# Patient Record
Sex: Female | Born: 1955 | Race: White | Hispanic: No | Smoking: Never smoker
Health system: Southern US, Community
[De-identification: ages and names within clinical notes are randomized; demographics above are authoritative.]

## PROBLEM LIST (undated history)

## (undated) DIAGNOSIS — I2699 Other pulmonary embolism without acute cor pulmonale: Secondary | ICD-10-CM

## (undated) DIAGNOSIS — K859 Acute pancreatitis without necrosis or infection, unspecified: Secondary | ICD-10-CM

## (undated) DIAGNOSIS — D126 Benign neoplasm of colon, unspecified: Secondary | ICD-10-CM

## (undated) DIAGNOSIS — Z972 Presence of dental prosthetic device (complete) (partial): Secondary | ICD-10-CM

## (undated) DIAGNOSIS — T7840XA Allergy, unspecified, initial encounter: Secondary | ICD-10-CM

## (undated) DIAGNOSIS — M1990 Unspecified osteoarthritis, unspecified site: Secondary | ICD-10-CM

## (undated) DIAGNOSIS — Z973 Presence of spectacles and contact lenses: Secondary | ICD-10-CM

## (undated) DIAGNOSIS — D649 Anemia, unspecified: Secondary | ICD-10-CM

## (undated) DIAGNOSIS — Z9889 Other specified postprocedural states: Secondary | ICD-10-CM

## (undated) DIAGNOSIS — K621 Rectal polyp: Secondary | ICD-10-CM

## (undated) DIAGNOSIS — D1391 Familial adenomatous polyposis: Secondary | ICD-10-CM

## (undated) DIAGNOSIS — R7303 Prediabetes: Secondary | ICD-10-CM

## (undated) DIAGNOSIS — K219 Gastro-esophageal reflux disease without esophagitis: Secondary | ICD-10-CM

## (undated) DIAGNOSIS — D689 Coagulation defect, unspecified: Secondary | ICD-10-CM

## (undated) DIAGNOSIS — R112 Nausea with vomiting, unspecified: Secondary | ICD-10-CM

## (undated) DIAGNOSIS — D369 Benign neoplasm, unspecified site: Secondary | ICD-10-CM

## (undated) HISTORY — DX: Coagulation defect, unspecified: D68.9

## (undated) HISTORY — PX: MULTIPLE TOOTH EXTRACTIONS: SHX2053

## (undated) HISTORY — DX: Unspecified osteoarthritis, unspecified site: M19.90

## (undated) HISTORY — DX: Familial adenomatous polyposis: D13.91

## (undated) HISTORY — PX: ESOPHAGOGASTRODUODENOSCOPY: SHX1529

## (undated) HISTORY — PX: TUBAL LIGATION: SHX77

## (undated) HISTORY — DX: Other pulmonary embolism without acute cor pulmonale: I26.99

## (undated) HISTORY — DX: Benign neoplasm, unspecified site: D36.9

## (undated) HISTORY — DX: Allergy, unspecified, initial encounter: T78.40XA

## (undated) HISTORY — PX: FLEXIBLE SIGMOIDOSCOPY: SHX1649

## (undated) HISTORY — DX: Gastro-esophageal reflux disease without esophagitis: K21.9

## (undated) HISTORY — PX: HERNIA REPAIR: SHX51

## (undated) HISTORY — PX: ELBOW SURGERY: SHX618

## (undated) HISTORY — DX: Rectal polyp: K62.1

## (undated) HISTORY — DX: Benign neoplasm of colon, unspecified: D12.6

## (undated) HISTORY — PX: CARPAL TUNNEL RELEASE: SHX101

## (undated) HISTORY — PX: APPENDECTOMY: SHX54

## (undated) HISTORY — DX: Acute pancreatitis without necrosis or infection, unspecified: K85.90

## (undated) HISTORY — PX: LUMBAR SPINE SURGERY: SHX701

## (undated) SURGERY — EGD (ESOPHAGOGASTRODUODENOSCOPY)
Anesthesia: Monitor Anesthesia Care

---

## 1974-10-17 HISTORY — PX: TOTAL COLECTOMY: SHX852

## 1988-10-17 HISTORY — PX: ROTATOR CUFF REPAIR: SHX139

## 1998-06-30 ENCOUNTER — Ambulatory Visit (HOSPITAL_COMMUNITY): Admission: RE | Admit: 1998-06-30 | Discharge: 1998-06-30 | Payer: Self-pay | Admitting: Family Medicine

## 1998-10-27 ENCOUNTER — Other Ambulatory Visit: Admission: RE | Admit: 1998-10-27 | Discharge: 1998-10-27 | Payer: Self-pay | Admitting: Gastroenterology

## 1998-12-31 ENCOUNTER — Encounter: Payer: Self-pay | Admitting: Gastroenterology

## 1998-12-31 ENCOUNTER — Ambulatory Visit (HOSPITAL_COMMUNITY): Admission: RE | Admit: 1998-12-31 | Discharge: 1998-12-31 | Payer: Self-pay | Admitting: Gastroenterology

## 1999-11-11 ENCOUNTER — Other Ambulatory Visit: Admission: RE | Admit: 1999-11-11 | Discharge: 1999-11-11 | Payer: Self-pay | Admitting: Gastroenterology

## 1999-11-11 ENCOUNTER — Encounter (INDEPENDENT_AMBULATORY_CARE_PROVIDER_SITE_OTHER): Payer: Self-pay | Admitting: Specialist

## 2000-10-27 ENCOUNTER — Other Ambulatory Visit: Admission: RE | Admit: 2000-10-27 | Discharge: 2000-10-27 | Payer: Self-pay | Admitting: Gastroenterology

## 2000-10-27 ENCOUNTER — Encounter (INDEPENDENT_AMBULATORY_CARE_PROVIDER_SITE_OTHER): Payer: Self-pay | Admitting: Specialist

## 2001-05-08 ENCOUNTER — Ambulatory Visit (HOSPITAL_BASED_OUTPATIENT_CLINIC_OR_DEPARTMENT_OTHER): Admission: RE | Admit: 2001-05-08 | Discharge: 2001-05-08 | Payer: Self-pay | Admitting: Orthopedic Surgery

## 2002-01-28 ENCOUNTER — Other Ambulatory Visit: Admission: RE | Admit: 2002-01-28 | Discharge: 2002-01-28 | Payer: Self-pay | Admitting: Obstetrics and Gynecology

## 2002-11-07 ENCOUNTER — Encounter: Payer: Self-pay | Admitting: Gastroenterology

## 2002-11-07 ENCOUNTER — Ambulatory Visit (HOSPITAL_COMMUNITY): Admission: RE | Admit: 2002-11-07 | Discharge: 2002-11-07 | Payer: Self-pay | Admitting: Gastroenterology

## 2003-02-04 ENCOUNTER — Other Ambulatory Visit: Admission: RE | Admit: 2003-02-04 | Discharge: 2003-02-04 | Payer: Self-pay | Admitting: Obstetrics and Gynecology

## 2004-02-09 ENCOUNTER — Other Ambulatory Visit: Admission: RE | Admit: 2004-02-09 | Discharge: 2004-02-09 | Payer: Self-pay | Admitting: Obstetrics and Gynecology

## 2004-02-16 ENCOUNTER — Ambulatory Visit (HOSPITAL_COMMUNITY): Admission: RE | Admit: 2004-02-16 | Discharge: 2004-02-16 | Payer: Self-pay | Admitting: Orthopedic Surgery

## 2004-04-08 ENCOUNTER — Ambulatory Visit (HOSPITAL_BASED_OUTPATIENT_CLINIC_OR_DEPARTMENT_OTHER): Admission: RE | Admit: 2004-04-08 | Discharge: 2004-04-08 | Payer: Self-pay | Admitting: Orthopedic Surgery

## 2004-12-27 ENCOUNTER — Ambulatory Visit: Payer: Self-pay | Admitting: Gastroenterology

## 2005-01-11 ENCOUNTER — Ambulatory Visit: Payer: Self-pay | Admitting: Gastroenterology

## 2005-02-22 ENCOUNTER — Other Ambulatory Visit: Admission: RE | Admit: 2005-02-22 | Discharge: 2005-02-22 | Payer: Self-pay | Admitting: Obstetrics and Gynecology

## 2006-11-17 ENCOUNTER — Ambulatory Visit: Payer: Self-pay | Admitting: Gastroenterology

## 2006-11-30 ENCOUNTER — Ambulatory Visit: Payer: Self-pay | Admitting: Internal Medicine

## 2006-11-30 ENCOUNTER — Encounter (INDEPENDENT_AMBULATORY_CARE_PROVIDER_SITE_OTHER): Payer: Self-pay | Admitting: *Deleted

## 2007-01-26 ENCOUNTER — Ambulatory Visit: Payer: Self-pay | Admitting: Internal Medicine

## 2007-02-02 ENCOUNTER — Encounter (INDEPENDENT_AMBULATORY_CARE_PROVIDER_SITE_OTHER): Payer: Self-pay | Admitting: Specialist

## 2007-02-02 ENCOUNTER — Ambulatory Visit (HOSPITAL_COMMUNITY): Admission: RE | Admit: 2007-02-02 | Discharge: 2007-02-02 | Payer: Self-pay | Admitting: Internal Medicine

## 2007-07-20 ENCOUNTER — Encounter: Payer: Self-pay | Admitting: Internal Medicine

## 2008-01-01 ENCOUNTER — Ambulatory Visit: Payer: Self-pay | Admitting: Internal Medicine

## 2008-01-04 ENCOUNTER — Ambulatory Visit: Payer: Self-pay | Admitting: Internal Medicine

## 2008-01-04 ENCOUNTER — Encounter: Payer: Self-pay | Admitting: Internal Medicine

## 2008-06-20 ENCOUNTER — Encounter: Admission: RE | Admit: 2008-06-20 | Discharge: 2008-06-20 | Payer: Self-pay | Admitting: Obstetrics and Gynecology

## 2008-12-19 ENCOUNTER — Encounter: Admission: RE | Admit: 2008-12-19 | Discharge: 2008-12-19 | Payer: Self-pay | Admitting: Obstetrics and Gynecology

## 2009-02-09 ENCOUNTER — Encounter: Payer: Self-pay | Admitting: Internal Medicine

## 2009-02-10 ENCOUNTER — Encounter: Payer: Self-pay | Admitting: Internal Medicine

## 2009-02-11 ENCOUNTER — Telehealth: Payer: Self-pay | Admitting: Internal Medicine

## 2009-02-12 ENCOUNTER — Encounter: Payer: Self-pay | Admitting: Internal Medicine

## 2009-02-12 ENCOUNTER — Ambulatory Visit: Payer: Self-pay | Admitting: Internal Medicine

## 2009-03-10 ENCOUNTER — Telehealth: Payer: Self-pay | Admitting: Internal Medicine

## 2009-07-08 ENCOUNTER — Encounter: Admission: RE | Admit: 2009-07-08 | Discharge: 2009-07-08 | Payer: Self-pay | Admitting: Obstetrics and Gynecology

## 2009-12-01 ENCOUNTER — Telehealth: Payer: Self-pay | Admitting: Internal Medicine

## 2010-03-23 ENCOUNTER — Encounter (INDEPENDENT_AMBULATORY_CARE_PROVIDER_SITE_OTHER): Payer: Self-pay | Admitting: *Deleted

## 2010-03-26 ENCOUNTER — Ambulatory Visit: Payer: Self-pay | Admitting: Internal Medicine

## 2010-03-26 ENCOUNTER — Encounter (INDEPENDENT_AMBULATORY_CARE_PROVIDER_SITE_OTHER): Payer: Self-pay | Admitting: *Deleted

## 2010-04-01 ENCOUNTER — Ambulatory Visit: Payer: Self-pay | Admitting: Internal Medicine

## 2010-04-04 ENCOUNTER — Encounter: Payer: Self-pay | Admitting: Internal Medicine

## 2010-05-27 ENCOUNTER — Telehealth: Payer: Self-pay | Admitting: Internal Medicine

## 2010-06-18 ENCOUNTER — Encounter: Payer: Self-pay | Admitting: Internal Medicine

## 2010-09-20 ENCOUNTER — Encounter: Payer: Self-pay | Admitting: Internal Medicine

## 2010-09-21 ENCOUNTER — Encounter: Payer: Self-pay | Admitting: Internal Medicine

## 2010-09-21 ENCOUNTER — Telehealth: Payer: Self-pay | Admitting: Internal Medicine

## 2010-09-23 ENCOUNTER — Encounter: Payer: Self-pay | Admitting: Internal Medicine

## 2010-09-24 ENCOUNTER — Encounter: Payer: Self-pay | Admitting: Internal Medicine

## 2010-09-24 DIAGNOSIS — K838 Other specified diseases of biliary tract: Secondary | ICD-10-CM | POA: Insufficient documentation

## 2010-09-30 ENCOUNTER — Ambulatory Visit: Payer: Self-pay | Admitting: Internal Medicine

## 2010-10-17 DIAGNOSIS — R112 Nausea with vomiting, unspecified: Secondary | ICD-10-CM

## 2010-10-17 DIAGNOSIS — K859 Acute pancreatitis without necrosis or infection, unspecified: Secondary | ICD-10-CM

## 2010-10-17 DIAGNOSIS — D689 Coagulation defect, unspecified: Secondary | ICD-10-CM

## 2010-10-17 DIAGNOSIS — Z9889 Other specified postprocedural states: Secondary | ICD-10-CM

## 2010-10-17 HISTORY — DX: Other specified postprocedural states: R11.2

## 2010-10-17 HISTORY — DX: Other specified postprocedural states: Z98.890

## 2010-10-17 HISTORY — DX: Coagulation defect, unspecified: D68.9

## 2010-10-17 HISTORY — DX: Acute pancreatitis without necrosis or infection, unspecified: K85.90

## 2010-10-27 ENCOUNTER — Encounter: Payer: Self-pay | Admitting: Internal Medicine

## 2010-11-02 ENCOUNTER — Encounter: Payer: Self-pay | Admitting: Internal Medicine

## 2010-11-04 ENCOUNTER — Encounter: Payer: Self-pay | Admitting: Internal Medicine

## 2010-11-16 NOTE — Progress Notes (Signed)
Summary: Triage   Phone Note Call from Patient Call back at Home Phone [phone]   Caller: Patient Call For: Dr. Leone Payor Reason for Call: Talk to Nurse Summary of Call: Pt wants to know if we can work her in for a egd before friday when she goes to [facility] to see the surgeon Initial call taken by: [place] Johnson,  September 21, 2010 3:19 PM  Follow-up for Phone Call        Patient states she was contacted by Dr Recardo Evangelist office about needing a colon before her office visit on Friday with Dr Abigail Miyamoto.  Reviewed with Dr Leone Payor . He has asked that we contact Dr Recardo Evangelist office and advise them of the flex she had in June of  this year.  I have left a voicemail with her assistant Bridgette and asked her to call me back.  I have faxed the procedures from [phone] along with their paths to her attention.  Patient  is advised that we will tentatively plan for flex on Thursday am if Dr Abigail Miyamoto would like the flex repeated. Follow-up by: Darcey Nora RN, CGRN,  September 21, 2010 4:02 PM     Appended Document: Triage I spoke with Bridgette and they have the records I sent last night and they say that this should work for the appointment on Friday.  I have asked them to call us back if we can be of any further assistance.     Appended Document: Xray Patient scheduled for KUB to Verify Stent Location 09/30/10 at [facility] for 4pm. 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

## 2010-11-16 NOTE — Progress Notes (Signed)
Summary: Samples of Medication   Phone Note Call from Patient Call back at [phone]   Caller: Patient Call For: Dr. Leone Payor Reason for Call: Refill Medication, Talk to Nurse Summary of Call: Pt. wants to know if we have any samples of Celebrex. Her doctor at [facility] does not get samples and she is waiting on Ins. to approve the medication Initial call taken by: Karna Christmas,  December 01, 2009 9:05 AM  Follow-up for Phone Call        Advised pt we do not get Celebrex samples.  Pt agreeable. Follow-up by: Francee Piccolo CMA Duncan Dull),  December 02, 2009 11:40 AM

## 2010-11-16 NOTE — Miscellaneous (Signed)
Summary: [facility] Previsit/prep  Clinical Lists Changes  Allergies: Added new allergy or adverse reaction of SULFA Observations: Added new observation of NKA: F (03/26/2010 15:51)

## 2010-11-16 NOTE — Procedures (Signed)
Summary: [facility]  [facility]   Imported By: Lester [place] 07/02/2010 08:51:00  _____________________________________________________________________  External Attachment:    Type:   Image     Comment:   External Document

## 2010-11-16 NOTE — Letter (Signed)
Summary[facility]  [facility]   Imported By: Lester [place] 07/02/2010 08:55:47  _____________________________________________________________________  External Attachment:    Type:   Image     Comment:   External Document

## 2010-11-16 NOTE — Letter (Signed)
Summary: [facility] Instructions  [facility]  [address] [place], [place] [address]   Phone: [phone]  Fax: [phone]       Emma Lutz    1956-05-09    MRN: [id number]        Procedure Day Dorna Bloom:  Lenor Coffin  04/01/10     Arrival Time:  7:30AM     Procedure Time:  8:30AM     Location of Procedure:                    _X _  [facility] (4th Floor)  PREPARATION FOR COLONOSCOPY WITH MOVIPREP   Starting 5 days prior to your procedure 03/27/10 do not eat nuts, seeds, popcorn, corn, beans, peas,  salads, or any raw vegetables.  Do not take any fiber supplements (e.g. Metamucil, Citrucel, and Benefiber).  THE DAY BEFORE YOUR PROCEDURE         DATE: 03/31/10  DAY: WEDNESDAY  1.  Drink clear liquids the entire day-NO SOLID FOOD  2.  Do not drink anything colored red or purple.  Avoid juices with pulp.  No orange juice.  3.  Drink at least 64 oz. (8 glasses) of fluid/clear liquids during the day to prevent dehydration and help the prep work efficiently.  CLEAR LIQUIDS INCLUDE: Water Jello Ice Popsicles Tea (sugar ok, no milk/cream) Powdered fruit flavored drinks Coffee (sugar ok, no milk/cream) Gatorade Juice: apple, white grape, white cranberry  Lemonade Clear bullion, consomm, broth Carbonated beverages (any kind) Strained chicken noodle soup Hard Candy                             4.  In the morning, mix first dose of MoviPrep solution:    Empty 1 Pouch A and 1 Pouch B into the disposable container    Add lukewarm drinking water to the top line of the container. Mix to dissolve    Refrigerate (mixed solution should be used within 24 hrs)  5.  Begin drinking the prep at 5:00 p.m. The MoviPrep container is divided by 4 marks.   Every 15 minutes drink the solution down to the next mark (approximately 8 oz) until the full liter is complete.   6.  Follow completed prep with 16 oz of clear liquid of your choice (Nothing red or purple).   Continue to drink clear liquids until bedtime.  7.  Before going to bed, mix second dose of MoviPrep solution:    Empty 1 Pouch A and 1 Pouch B into the disposable container    Add lukewarm drinking water to the top line of the container. Mix to dissolve    Refrigerate  THE DAY OF YOUR PROCEDURE      DATE: 04/01/10  DAY: THURSDAY  Beginning at 3:30AM (5 hours before procedure):         1. Every 15 minutes, drink the solution down to the next mark (approx 8 oz) until the full liter is complete.  2. Follow completed prep with 16 oz. of clear liquid of your choice.    3. You may drink clear liquids until 6:30AM (2 HOURS BEFORE PROCEDURE).   MEDICATION INSTRUCTIONS  Unless otherwise instructed, you should take regular prescription medications with a small sip of water   as early as possible the morning of your procedure.          OTHER INSTRUCTIONS  You will need a responsible adult at  least 56 years of age to accompany you and drive you home.   This person must remain in the waiting room during your procedure.  Wear loose fitting clothing that is easily removed.  Leave jewelry and other valuables at home.  However, you may wish to bring a book to read or  an iPod/MP3 player to listen to music as you wait for your procedure to start.  Remove all body piercing jewelry and leave at home.  Total time from sign-in until discharge is approximately 2-3 hours.  You should go home directly after your procedure and rest.  You can resume normal activities the  day after your procedure.  The day of your procedure you should not:   Drive   Make legal decisions   Operate machinery   Drink alcohol   Return to work  You will receive specific instructions about eating, activities and medications before you leave.    The above instructions have been reviewed and explained to me by   Wyona Almas RN  March 26, 2010 4:44 PM _    I fully understand and can verbalize these  instructions _____________________________ Date _________

## 2010-11-16 NOTE — Letter (Signed)
Summary: Patient Notice- Polyp Results  [facility]  [address] [place], [place] [address]   Phone: [phone]  Fax: [phone]        April 04, 2010 MRN: [id number]    [facility] [address] [place], [place]  [address]    Dear Ms. Poteete,  I am pleased to inform you that the rectal and colon polyps removed during your recent sigmoidoscopy were found to be benign (no cancer detected) upon pathologic examination.  I recommend you have a repeat sigmoidoscopy examination in 1 year to monitor the polyposis and look for cancer and other problems you are at risk for. Continue Celebrex and follow-up with Dr. Wyline Mood also.  Should you develop new or worsening symptoms of abdominal pain, bowel habit changes or bleeding from the rectum or bowels, please schedule an evaluation with either your primary care physician or with me.  Please call us if you are having persistent problems or have questions about your condition that have not been fully answered at this time.  Sincerely,  Iva Boop MD, [facility]  This letter has been electronically signed by your physician.  Appended Document: Patient Notice- Polyp Results letter mailed.

## 2010-11-16 NOTE — Letter (Signed)
Summary: [facility]  [address] [place], [place] [address]   Phone: [phone]  Fax: [phone]       KAMYIAH COLANTONIO    Aug 05, 1956    MRN: [id number]        Procedure Day Dorna Bloom:  Lenor Coffin  04/01/10     Arrival Time:  7:30am     Procedure Time:  8:30am     Location of Procedure:                    Juliann Pares  [facility] (4th Floor)    PREPARATION FOR FLEXIBLE SIGMOIDOSCOPY WITH MAGNESIUM CITRATE  Prior to the day before your procedure, purchase one 8 oz. bottle of Magnesium Citrate and one Fleet Enema from the laxative section of your drugstore.  _________________________________________________________________________________________________  THE DAY BEFORE YOUR PROCEDURE             DATE: 03/31/10   DAY:  WEDNESDAY  1.   Have a clear liquid dinner the night before your procedure.  2.   Do not drink anything colored red or purple.  Avoid juices with pulp.  No orange juice.              CLEAR LIQUIDS INCLUDE: Water Jello Ice Popsicles Tea (sugar ok, no milk/cream) Powdered fruit flavored drinks Coffee (sugar ok, no milk/cream) Gatorade Juice: apple, white grape, white cranberry  Lemonade Clear bullion, consomm, broth Carbonated beverages (any kind) Strained chicken noodle soup Hard Candy   3.   At 7:00 pm the night before your procedure, drink one bottle of Magnesium Citrate over ice.  4.   Drink at least 3 more glasses of clear liquids before bedtime (preferably juices).  5.   Results are expected usually within 1 to 6 hours after taking the Magnesium Citrate.  ___________________________________________________________________________________________________  THE DAY OF YOUR PROCEDURE            DATE: 04/01/10 DAY:  THURSDAY  1.   Use Fleet Enema one hour prior to coming for procedure.  2.   You may drink clear liquids until  6:30am (2 hours before exam)       MEDICATION INSTRUCTIONS  Unless  otherwise instructed, you should take regular prescription medications with a small sip of water as early as possible the morning of your procedure.        OTHER INSTRUCTIONS  You will need a responsible adult at least 56 years of age to accompany you and drive you home.   This person must remain in the waiting room during your procedure.  Wear loose fitting clothing that is easily removed.  Leave jewelry and other valuables at home.  However, you may wish to bring a book to read or an iPod/MP3 player to listen to music as you wait for your procedure to start.  Remove all body piercing jewelry and leave at home.  Total time from sign-in until discharge is approximately 2-3 hours.  You should go home directly after your procedure and rest.  You can resume normal activities the day after your procedure.  The day of your procedure you should not:   Drive   Make legal decisions   Operate machinery   Drink alcohol   Return to work  You will receive specific instructions about eating, activities and medications before you leave.   The above instructions have been reviewed and explained to me by   Wyona Almas RN  March 26, 2010 4:33  PM     I fully understand and can verbalize these instructions _____________________________ Date _________   Appended Document: Flexsig Instructons Changed pt. to [facility] prep.  She states she's hard to clean out for her flex sigs and has taken the Miralax prep in the past.

## 2010-11-16 NOTE — Progress Notes (Signed)
Summary: Triage   Phone Note Call from Patient Call back at Home Phone [phone]   Caller: Patient Call For: Dr. Leone Payor Reason for Call: Talk to Nurse Summary of Call: Pt. needs help getting appt. with Dr. Wyline Mood @ [facility] to sch'd an ENDO...she has tried to sch's appt. and has not received a call back. Initial call taken by: Karna Christmas,  May 27, 2010 11:39 AM  Follow-up for Phone Call        Pt. needs to schedule an Endoscopy with Dr.Branch, was told by Dr.Branch she needs to have it done in 01/2010. Pt. is unable to get any response from Dr.Branche's office.  Pt. wants to know if Dr.Gessner may know anything?   I will attempt to contact Dr.Branch's nurse. [phone], [phone])   Follow-up by: Laureen Ochs LPN,  May 27, 2010 11:47 AM  Additional Follow-up for Phone Call Additional follow up Details #1::        I left a message for [facility], in scheduling,  to call me.  I have also spoke with Dr.Branch's office,Tabitha, she sees a reminder in the system, doesn't know where communication broke down, but she will have Dr.Branche's nurse call pt.  Mrs.Pfund is aware she should be getting a call, but if she hasn't heard anything by Monday I advised her to call Tabitha at [phone]. Pt. instructed to call back as needed.  Additional Follow-up by: Laureen Ochs LPN,  May 27, 2010 12:01 PM

## 2010-11-16 NOTE — Procedures (Signed)
Summary: Flexible Sigmoidoscopy  Patient: Emma Lutz Note: All result statuses are Final unless otherwise noted.  Tests: (1) Flexible Sigmoidoscopy (FLX)  FLX Flexible Sigmoidoscopy                             DONE     [facility]     [address]     [place], [place]  [address]           FLEXIBLE SIGMOIDOSCOPY PROCEDURE REPORT           PATIENT:  Emma, Lutz  MR#:  [id number]     BIRTHDATE:  [birth year], 53 yrs. old  GENDER:  female           ENDOSCOPIST:  Iva Boop, MD, [facility]           PROCEDURE DATE:  04/01/2010     PROCEDURE:  Flexible Sigmoidoscopy with biopsy     ASA CLASS:  Class II     INDICATIONS:  history of polyps, screening and surveillance in a     patient with remaining rectosigmoid after subtotal colectomy for     familial polyposis           MEDICATIONS:   Fentanyl 50 mcg IV, Versed 5 mg IV           DESCRIPTION OF PROCEDURE:   After the risks benefits and     alternatives of the procedure were thoroughly explained, informed     consent was obtained.  Digital rectal exam was performed and     revealed no abnormalities.   The [id number] endoscope     was introduced through the anus and advanced to the ileum, without     limitations.  The quality of the prep was excellent.  The     instrument was then slowly withdrawn as the mucosa was fully     examined.     <<PROCEDUREIMAGES>>           There were polyps rectum to sigmoid. Numerous polyps as in the     past with maximum size 5-6 mm. There are some areas of mucosa     without polyps. Multiple biopsies were obtained and sent to     pathology.  There was a surgical anastomosis connecting distal     sigmoid colon to the ileum. It and the ileum appeared normal.     Retroflexion was not performed.  The scope was then withdrawn from     the patient and the procedure terminated.           COMPLICATIONS:  None           ENDOSCOPIC IMPRESSION:     1) Polyposis in the rectum to  sigmoid - multiple biopsies taken.           2) S/p subtotal colectomy for Familial Adenomatous Polyposis.           REPEAT EXAM:  In for Flexible Sigmoidoscopy. Await biopsies but     likely in 1 year     To continue upper GI tract surveillance at [facility] with Dr. Wyline Mood.     Continue Celebrex per Dr. Wyline Mood.           Iva Boop, MD, [facility]           CC:  Marcille Blanco, MD     Doreatha Lew, MD     The Patient  n.     eSIGNED:   Iva Boop at 04/01/2010 09:18 AM           Kathreen Cosier, [id number]  Note: An exclamation mark (!) indicates a result that was not dispersed into the flowsheet. Document Creation Date: 04/01/2010 9:19 AM _______________________________________________________________________  (1) Order result status: Final Collection or observation date-time: 04/01/2010 09:04 Requested date-time:  Receipt date-time:  Reported date-time:  Referring Physician:   Ordering Physician: Stan Head [phone]) Specimen Source:  Source: Launa Grill Order Number: [id number] Lab site:   Appended Document: Flexible Sigmoidoscopy     Procedures Next Due Date:    Flexible Sigmoidoscopy: 04/2011  Appended Document: Flexible Sigmoidoscopy   Flexible Sigmoidoscopy  Procedure date:  04/01/2010  Findings:      merous rectal adenomas (diminutive)

## 2010-11-18 NOTE — Letter (Signed)
Summary: [facility]  [facility]   Imported By: Lester [place] 10/05/2010 12:58:04  _____________________________________________________________________  External Attachment:    Type:   Image     Comment:   External Document

## 2010-11-18 NOTE — Letter (Signed)
Summary: [facility]  [facility]   Imported By: Lester [place] 10/05/2010 13:01:59  _____________________________________________________________________  External Attachment:    Type:   Image     Comment:   External Document

## 2010-11-18 NOTE — Letter (Signed)
Summary[facility]  [facility]   Imported By: Lester  11/11/2010 10:49:27  _____________________________________________________________________  External Attachment:    Type:   Image     Comment:   External Document

## 2010-11-18 NOTE — Miscellaneous (Signed)
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 Pancreatic sphincterotomy performed prior. Possibe defect at ampullectomy site treated with clip and biliary stent. 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 Pancreatic sphincterotomy performed prior. Possibe defect at ampullectomy site treated with clip and biliary stent.  Comments:      Needs KUB 12/15 to see if pancreatic stent remains.   Appended Document: Xray Patient scheduled for KUB to Verify Stent Location 09/30/10 at [facility] for 4pm. 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 Please see office note from [facility] for 09/24/10, it states that the patient currently has an ampullary stent in place. I have called [facility] and requested a copy of the KUB report. Selinda Michaels RN  October 12, 2010 4:42 PM     Clinical Lists Changes

## 2010-11-18 NOTE — Procedures (Signed)
Summary: ERCP/[facility]  ERCP/[facility]   Imported By: Lester [place] 09/30/2010 11:13:20  _____________________________________________________________________  External Attachment:    Type:   Image     Comment:   External Document

## 2010-11-18 NOTE — Procedures (Signed)
Summary: ERCP/[facility]  ERCP/[facility]   Imported By: Lester [place] 11/11/2010 10:52:17  _____________________________________________________________________  External Attachment:    Type:   Image     Comment:   External Document

## 2010-12-30 ENCOUNTER — Telehealth: Payer: Self-pay | Admitting: Internal Medicine

## 2011-01-04 NOTE — Progress Notes (Signed)
Summary: Triage   Phone Note From Other Clinic   Caller: Amy @ [facility] [phone] Call For: Dr. Leone Payor Summary of Call: Admitted in hosp. for acute pancreatitis once discharged requesting pt. be seen for f/u in 1-2 wks Initial call taken by: Karna Christmas,  December 30, 2010 2:42 PM  Follow-up for Phone Call        patient was seen at [facility], but they would like her to follow up here.  They were going to have her records faxed.  She is scheduled for 01/07/11  Follow-up by: Darcey Nora RN, CGRN,  December 30, 2010 4:07 PM

## 2011-01-07 ENCOUNTER — Ambulatory Visit (INDEPENDENT_AMBULATORY_CARE_PROVIDER_SITE_OTHER): Payer: BC Managed Care – PPO | Admitting: Internal Medicine

## 2011-01-07 ENCOUNTER — Other Ambulatory Visit (INDEPENDENT_AMBULATORY_CARE_PROVIDER_SITE_OTHER): Payer: BC Managed Care – PPO

## 2011-01-07 ENCOUNTER — Telehealth: Payer: Self-pay

## 2011-01-07 ENCOUNTER — Encounter: Payer: Self-pay | Admitting: Internal Medicine

## 2011-01-07 VITALS — BP 128/76 | HR 64 | Ht 63.0 in | Wt 190.0 lb

## 2011-01-07 DIAGNOSIS — E876 Hypokalemia: Secondary | ICD-10-CM

## 2011-01-07 DIAGNOSIS — K859 Acute pancreatitis without necrosis or infection, unspecified: Secondary | ICD-10-CM

## 2011-01-07 DIAGNOSIS — D696 Thrombocytopenia, unspecified: Secondary | ICD-10-CM

## 2011-01-07 LAB — COMPREHENSIVE METABOLIC PANEL
ALT: 23 U/L (ref 0–35)
AST: 25 U/L (ref 0–37)
Alkaline Phosphatase: 66 U/L (ref 39–117)
Creatinine, Ser: 0.8 mg/dL (ref 0.4–1.2)
GFR: 77.83 mL/min (ref >60.00)
Sodium: 142 mEq/L (ref 135–145)
Total Bilirubin: 0.8 mg/dL (ref 0.3–1.2)

## 2011-01-07 LAB — CBC WITH DIFFERENTIAL/PLATELET
Basophils Relative: 0.8 % (ref 0.0–3.0)
Eosinophils Relative: 2.4 % (ref 0.0–5.0)
Hemoglobin: 15.3 g/dL — ABNORMAL HIGH (ref 12.0–15.0)
MCV: 85.6 fl (ref 78.0–100.0)
Monocytes Absolute: 0.4 10*3/uL (ref 0.1–1.0)
Neutro Abs: 1.7 10*3/uL (ref 1.4–7.7)
Neutrophils Relative %: 45.7 % (ref 43.0–77.0)
RBC: 5.19 Mil/uL — ABNORMAL HIGH (ref 3.87–5.11)
WBC: 3.7 10*3/uL — ABNORMAL LOW (ref 4.5–10.5)

## 2011-01-07 NOTE — Progress Notes (Signed)
  Subjective:    Patient ID: Emma Lutz, female    DOB: 1956/02/28, 55 y.o.   MRN: [id number]  HPI This is a 55 year old white woman here for followup after 2 episodes of pancreatitis. These were handled at an outside hospital. She has familial adenomatous polyposis and underwent removal of her ampulla via ERCP in December 2011. She was preparing for a completion proctectomy in February 2012. After undergoing a laparoscopy in anticipation of that , she was found to have ascites and the operation was canceled. It turned out she had pancreatitis. She recovered somewhat from that but had intermittent epigastric pain and lost about 16-20 pounds over time. She had another episode of epigastric pain he was admitted to [facility] on 313 2012 and discharged on 316 2012. At this time she feels better though she still sore in her abdomen. She is tolerating a low-fat diet. She is following up with me rather than the return to [facility]. She is supposed to have an ERCP in a few weeks to investigate the cause of pancreatitis.    review of discharge summary from [facility] shows that admission amylase was 908 with lipase 710. Normal comprehensive metabolic panel. She had mild hypokalemia with potassium 3.2 at discharge. She had a platelet count of 125 but otherwise normal complete blood count. 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 It had been seen on prior imaging and was awaiting further workup, if needed.      Review of Systems  no fevers or chills. Appetite has returned. No chest pain or respiratory difficulty.    Objective:   Physical Exam  Constitutional: She appears well-developed and well-nourished.  Cardiovascular: Normal rate, regular rhythm and normal heart sounds.   Pulmonary/Chest: Breath sounds normal.  Abdominal: Soft. Bowel sounds are normal. There  is tenderness in the epigastric area. There is no rebound and no guarding.         Mild fullness in epigastrium  Psychiatric: Her speech is normal. Her mood appears not anxious. Her affect is not inappropriate. She does not exhibit a depressed mood.          Assessment & Plan:

## 2011-01-07 NOTE — Telephone Encounter (Signed)
Message copied by Chrystie Nose on Fri Jan 07, 2011  3:36 PM ------      Message from: Stan Head      Created: Fri Jan 07, 2011  1:46 PM       Let her know labs are ok

## 2011-01-07 NOTE — Patient Instructions (Signed)
You will have labs checked today in the basement lab.  Please head down after you check out with the front desk  (CBC, CMET, amylase and lipase) Dr.Gessner will communicate results to Dr. Wyline Mood.

## 2011-01-07 NOTE — Telephone Encounter (Signed)
Patient notified of lab results per Dr. Leone Payor.

## 2011-01-10 ENCOUNTER — Encounter: Payer: Self-pay | Admitting: Internal Medicine

## 2011-01-10 DIAGNOSIS — E876 Hypokalemia: Secondary | ICD-10-CM | POA: Insufficient documentation

## 2011-01-10 DIAGNOSIS — K859 Acute pancreatitis without necrosis or infection, unspecified: Secondary | ICD-10-CM | POA: Insufficient documentation

## 2011-01-10 NOTE — Assessment & Plan Note (Signed)
Mild hypokalemia hospitalized. Will followup with lab testing

## 2011-01-10 NOTE — Assessment & Plan Note (Signed)
She seems to be recovering from her second episode of pancreatitis since endoscopic removal or ampulla. Given that it started after this procedure, one must suspect it's related to that. She could have stenosis of the pancreatic orifice at the ampulla at this point. Will recheck labs, and be available to help in the meantime. Otherwise she will followup at [facility] for planned ERCP to investigate as described above.

## 2011-01-11 ENCOUNTER — Telehealth: Payer: Self-pay | Admitting: *Deleted

## 2011-01-11 NOTE — Telephone Encounter (Signed)
Faxed encounter from 01/07/11 to Dr Caralyn Guile ar [facility]

## 2011-01-25 ENCOUNTER — Telehealth: Payer: Self-pay | Admitting: Internal Medicine

## 2011-01-25 DIAGNOSIS — K859 Acute pancreatitis without necrosis or infection, unspecified: Secondary | ICD-10-CM

## 2011-01-25 DIAGNOSIS — R7401 Elevation of levels of liver transaminase levels: Secondary | ICD-10-CM

## 2011-01-25 NOTE — Telephone Encounter (Signed)
i will help if possible with stent and suspect we can pull it here

## 2011-01-25 NOTE — Telephone Encounter (Signed)
I spoke with the patient this am.  She had an ERCP with stent placement at [facility] yesterday.  They are requesting a KUB to look for stent in 7-10 days here.  I will place order for 02/02/11.  Patient is asking if the stent must be removed can Dr Leone Payor do it here or does she need to return to [facility].  I have advised her that she will need to speak with [facility] about that prior to any decisions being made.  Dr Leone Payor I have her report and it is in your office.  Dr Leone Payor please sign radiology order also.

## 2011-01-26 NOTE — Telephone Encounter (Signed)
Relayed Dr Marvell Fuller message to the patient

## 2011-02-02 ENCOUNTER — Ambulatory Visit (INDEPENDENT_AMBULATORY_CARE_PROVIDER_SITE_OTHER)
Admission: RE | Admit: 2011-02-02 | Discharge: 2011-02-02 | Disposition: A | Discharge status: Home / Self Care | Payer: BC Managed Care – PPO | Source: Ambulatory Visit | Attending: Internal Medicine | Admitting: Internal Medicine

## 2011-02-02 ENCOUNTER — Telehealth: Payer: Self-pay | Admitting: *Deleted

## 2011-02-02 ENCOUNTER — Telehealth: Payer: Self-pay | Admitting: Internal Medicine

## 2011-02-02 DIAGNOSIS — K859 Acute pancreatitis without necrosis or infection, unspecified: Secondary | ICD-10-CM

## 2011-02-02 NOTE — Telephone Encounter (Signed)
She cannot use order placed for Abd xray on 01/25/11 because it did not go through as an order. Used the old order to reorder the xray.

## 2011-02-02 NOTE — Telephone Encounter (Signed)
Spoke with patient and gave her results as per Dr. Leone Payor. Pancreatic stint is out as hoped. Faxed the report to [phone] as per Dr. Leone Payor.

## 2011-02-07 ENCOUNTER — Encounter: Payer: Self-pay | Admitting: Internal Medicine

## 2011-02-08 ENCOUNTER — Encounter: Payer: Self-pay | Admitting: Internal Medicine

## 2011-02-08 NOTE — Telephone Encounter (Signed)
Error

## 2011-02-16 HISTORY — PX: PROCTECTOMY: SHX315

## 2011-03-01 NOTE — Assessment & Plan Note (Signed)
[facility]                         GASTROENTEROLOGY OFFICE NOTE   NAME:Emma Lutz, Emma Lutz                    MRN:          [id number]  DATE:01/01/2008                            DOB:          1956/04/12    PROBLEM:  1. Familial polyposis syndrome.  2. Rectum and portion of sigmoid remain after subtotal colectomy.   Emma Lutz is doing well.  No new problems.  She did meet Dr. Donnetta Hutching at [facility] and is willing to work with her.  The plan is for a  potentialJ-pouch with colostomy and then subsequent reanastomosis for  ileoanal anastomosis.  She has numerous rectal and sigmoid polyps that  fortunately have not shown any progression towards advanced neoplasia  over the years but remain a risk.  Recently had an upper endoscopy with  stable polyposis of the stomach and duodenum conducted by and conducted  by Dr. Caralyn Guile on October 26, 2007; that was at [facility].   MEDICATIONS:  1. Paxil daily.  2. Prevacid b.i.d.  3. Celebrex 400 mg b.i.d.   ALLERGIES:  SULFA.   PAST MEDICAL HISTORY:  1. Left carpal tunnel surgery.  2. Subtotal colectomy.  3. Right shoulder surgery.  4. Tubal ligation.  5. Right elbow surgery.  6. Lumbar surgery x2.  7. A familial adenomatous polyposis with colonic, gastric and duodenal      polyps.  8. Gastroesophageal reflux disease is suspected since she is on      Prevacid and she on Paxil presumably for depression and anxiety.      We will clarify on return.   PHYSICAL EXAMINATION:  She is in no acute distress.  Her weight is 191  pounds.  Pulse 72.  Blood pressure 118/78.   ASSESSMENT:  Familial polyposis syndrome with residual polyps in the  rectum and sigmoid.  Planning for resection of the remaining rectum and  colon and ileoanal anastomosis.   PLAN:  Surveillance flexible sigmoidoscopy.  She takes a full colon prep  usually and we will give her a MiraLax prep.  We will send those notes  and records up to Dr.  Abigail Lutz and Dr. Wyline Mood once complete.     Emma Boop, MD,FACG  Electronically Signed    CEG/MedQ  DD: 01/01/2008  DT: 01/02/2008  Job #: [id number]   cc:   Emma Curry, MD  Emma Lutz. Emma Miyamoto, MD  Emma Lutz, M.D.

## 2011-03-04 NOTE — Assessment & Plan Note (Signed)
[facility]                         GASTROENTEROLOGY OFFICE NOTE   NAME:COFFMANIslam, Villescas                      MRN:          [id number]  DATE:12/25/2006                            DOB:          January 03, 1956    Ms. Weaver has been a patient of Dr. Blossom Hoops. She has familial  adenomatous pulposus and is status post a subtotal colectomy. Over the  years Dr. Corinda Gubler has followed her rectal area that has numerous polyps.  I performed a sigmoidoscopy of this area in February. Biopsies were  taken. There were no dysplasia or cancer found though she had too  numerous to count diminutive polyps. Part of the area was obscured by  mucoid stool. The prep was not really adequate. I communicated with Dr.  Corinda Gubler and Dr. Wyline Mood. Dr. Wyline Mood has been performing upper endoscopies  on the patient to follow up with gastric and duodenal polyps, and  apparently things look good, i.e., no cancer or dysplasia in the polyps  there. She has been on Celebrex with a good response we think.   I called her today and explained that both Dr. Wyline Mood and I think it is  reasonable to think about a completion of proctectomy which could  potentially permit an ileoanal anastomosis I think. I told her the best  approach would be to discuss it with a colorectal surgeon. She is going  to think about this, and we would be happy to facilitate an appointment.  She could also discuss this further with Dr. Wyline Mood. I emphasized that  the cancer risk will still be there no matter how we monitor the rectum.   Because of the prep problems we have, I will plan for a repeat  sigmoidoscopy and consider argon plasma coagulation of the polyps  potentially in April. She is agreeable to this.     Iva Boop, MD,FACG  Electronically Signed    CEG/MedQ  DD: 12/25/2006  DT: 12/25/2006  Job #: [id number]   cc:   Duwayne Heck L. Mahaffey, M.D.  Caralyn Guile, M.D.

## 2011-03-04 NOTE — Op Note (Signed)
NAME:  Emma Lutz, Emma Lutz                       ACCOUNT NO.:  [id number]   MEDICAL RECORD NO.:  [id number]                   PATIENT TYPE:  AMB   LOCATION:  DSC                                  FACILITY:  [facility]   PHYSICIAN:  Katy Fitch. Naaman Plummer., M.D.          DATE OF BIRTH:  01-09-1956   DATE OF PROCEDURE:  04/08/2004  DATE OF DISCHARGE:                                 OPERATIVE REPORT   PREOPERATIVE DIAGNOSIS:  Chronic pain left wrist with tenderness overlying  scapholunate interosseous ligament and tenderness over dorsal ulnar aspect  of triangular fibrocartilage with MRI dated Feb 16, 2004, documenting  abnormal signal in the scapholunate interosseous ligament with a probable  lunate sided tear and edema in the adjacent surfaces of the triquetrum and  lunate with abnormal signal adjacent to the lunate and triquetrum, rule out  peripheral triangular fibrocartilage tear.   POSTOPERATIVE DIAGNOSIS:  Ulnar sided scapholunate interosseous ligament  tear with an intraligamentous ganglion cyst eroding into the lunate and  areas of full thickness chondromalacia on the dorsal ulnar aspect of the  lunate and a thick band of fibrous tissue connecting the dorsal ulnar rim of  the radius to the lunate and triquetrum.  This had the appearance of an  adhesion, possibly post traumatic fibrous cord, or a possible plica type  lesion.   OPERATION:  1. Diagnostic arthroscopy, right wrist.  2. Arthroscopic debridement of scapholunate interosseous ligament tear with     drainage of ganglion cyst and extensive debridement of interosseous space     between lunate and scaphoid.  3. Resection of dorsal plica/fibrous band connecting dorsal ulnar aspect of     radius to lunate triquetral interosseous ligament essentially creating an     ulnar sided compartment in the wrist blocking visualization of the     triangular fibrocartilage from the portal.   SURGEON:  Katy Fitch. Sypher, M.D.   ASSISTANT:  Annye Rusk, P.A.-C.   ANESTHESIA:  General by LMA.   ANESTHESIOLOGIST:  Kaylyn Layer. Michelle Piper, M.D.   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  She presented for evaluation on Feb 16, 2004, reporting a  three month history of pain in her left wrist.  She could not recall and  antecedent injury.  She is very active with her hands on the job and was of  the opinion that her wrist pain was consequent to a highly repetitive motion  on the job.  She had filed a Technical sales engineer.  Clinical  examination revealed signs of probable scapholunate interosseous ligament  injury and a probable triangular fibrocartilage abnormality.  She was  referred for an MRI of the wrist that was obtained on Feb 16, 2004, at [facility].  This was interpreted by Dr. Pecolia Ades to reveal an  intact triangular fibrocartilage with an irregularity on  the surface and  some edema changes in the lunate and triquetrum.  Dr. 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  There were no radiographic signs of  abutment.  Dr. Pecolia Ades also identified an abnormal appearance of the  scapholunate interosseous ligament with a probable lunate sided tear.  Careful inspection of Emma Lutz's plane x-rays revealed a small lucency in  the ulnar aspect of the lunate consistent with an interosseous ganglion  forming at the site of an injured scapholunate ligament.  I advised Ms.  Lutz to proceed with diagnostic arthroscopy at this time anticipating  debridement of her scapholunate interosseous ligament and possible  debridement of the triangular fibrocartilage.   PROCEDURE:  Emma Lutz is brought to the operating room and placed on  supine position on the operating table.  Following induction of general   anesthesia by LMA, the left arm was prepped with Betadine solution and  sterilely draped.  A pneumatic tourniquet was applied to the proximal  brachium.  Following exsanguination of the left arm with an Esmarch bandage,  an arterial tourniquet on the proximal brachium was inflated to 220 mmHg.  Finger traps were placed on the left index and long fingers followed by  distraction of the wrist in the tower designed for wrist arthroscopy with 10  pounds traction.  The procedure commenced with sounding the wrist with an 18  gauge needle.  The 3-4 dorsal portal was easily identified followed by use  of a blunt trocar to introduce the scope.  The scaphoid and lunate were well  visualized with normal hilar articular cartilage.  The scapholunate  interosseous ligament was ruptured on its lunate side and bulging with a  mucinous appearance consistent with an intraligamentous ganglion.  With an  attempt to pass the scope to the ulnar aspect of the wrist, we encountered  firm fibrous tissue between the ulnar aspect of the lunate facet and the  lunatotriquetral interosseous ligament.  There was simply no way to enter  the ulnar carpal joint.  Therefore, a 6R portal was created with needle  sounding followed by use of a blunt trocar.  The scope was introduced and,  again, a wall of fibrous tissue was encountered.  There was damage to the  hyaline cartilage on the dorsal aspect of the lunate with chondromalacia.  A  suction shaver was placed after following as blunt trocar path through the  6R portal with visualization through 3-4.  The fibrous band was carefully  resected utilizing the suction shaver.  This ultimately opened up the ulnar  carpal joint to allow visualization of the triangular fibrocartilage.  This  was visualized and palpated with a nerve hook and found to be intact.  The  prestyloid recess was normal.  There appeared to be significant scarring to the lunatotriquetral interosseous ligament  from the dorsal aspect of the  triangular fibrocartilage and the ulnar aspect of the lunate facet of the  radius.  This was highly unusual and never encountered to this degree before  in my arthroscopic practice.  This was completely removed until there was a  normal recess on the ulnar aspect of the joint.  The scope was then placed  in the 6R portal and the scapholunate interosseous ligament cyst probed with  recovery of quite a bit of ganglion type mucinous material.  This was then  debrided with the suction shaver and the suction shaver was  placed between  the lunate and scaphoid to thoroughly clean out mucinous material as well as  debride the free margin of the torn scapholunate interosseous ligament.  Our  final diagnosis was what appeared to be a fibrous band causing impingement  between the lunate triquetrum, triangular fibrocartilage, and radius  dorsally and a torn scapholunate interosseous ligament with interligamentous  ganglion formation.  The prognosis for both of these conditions is  uncertain.  I suspect that the scapholunate interosseous ligament injury  will remain problematic and may lead to progressive ganglion formation in  the future.  The fibrous band will probably not reform as long as range of  motion exercises are initiated early.   For aftercare, Ms. Marschall is given prescriptions for Percocet 5 mg 1-2  tablets p.o. q.4-6h. p.r.n. pain and Keflex 500 mg 1 p.o. q.8h. x 4 days as  a prophylactic antibiotic.  She will return to our office in follow up in  approximately 4-5 days for dressing change and advancement to a gentle range  of motion exercise program in and out of a Velcro splint.                                               Katy Fitch Naaman Plummer., M.D.    RVS/MEDQ  D:  04/08/2004  T:  04/09/2004  Job:  [id number]

## 2011-03-04 NOTE — Op Note (Signed)
[place]  Patient:    Emma Lutz, Emma Lutz                   MRN: [id number] Proc. Date: 05/08/01 Attending:  Katy Fitch. Naaman Plummer., M.D.                           Operative Report  PREOPERATIVE DIAGNOSIS:  Entrapment neuropathy, median nerve, left carpal tunnel.  POSTOPERATIVE DIAGNOSIS:  Entrapment neuropathy, median nerve, left carpal tunnel.  PROCEDURE:  Release of left transverse carpal ligament.  SURGEON:  Katy Fitch. Sypher, Montez Hageman., M.D.  ASSISTANT:  Jonni Sanger, P.A.  ANESTHESIA:  General by mask, supervised by the anesthesiologist, Edwin Cap. Zoila Shutter, M.D.  INDICATIONS:  Emma Lutz is a 55 year old woman who has had chronic numbness in her left median-innervated fingers.  Clinical examination suggests a carpal tunnel syndrome, and electrodiagnostic studies confirm median neuropathy at the left wrist.  Due to a failure to respond to nonoperative measures, she is brought to the operating room at this time for release of her left transverse carpal ligament.  DESCRIPTION OF PROCEDURE:  Emma Lutz was brought to the operating room and placed in the supine position on the operating table.  Following induction of general anesthesia by mask, the left arm was prepped with Betadine soap and solution and sterilely draped.  Following exsanguination of the limb with Esmarch bandage, arterial tourniquet was placed at 240 mmHg.  The procedure commenced with a short incision in the line of the ring finger of the palm. Subcutaneous tissues were carefully divided to reveal the palmar fascia.  This was split longitudinally to reveal the common sensory branch of the median nerve.  These were followed back to the transverse carpal ligament, which was carefully isolated from the median nerve.  The ligament was released on its ulnar border, extending to the distal forearm.  This widely opened the carpal canal.  No masses or other  predicaments were noted.  Bleeding points were electrocauterized with bipolar current, followed by repair of the skin with intradermal 3-0 Prolene suture.  A compressive dressing applied with a volar plaster splint maintaining the wrist in 5 degrees of dorsiflexion.  There were no apparent complications. For aftercare, Ms. Kruck is given a prescription for Percocet 5 mg one or two tablets p.o. q.4-6h. p.r.n. pain, a total of 20 tablets without refill. She will return to our office in seven to 10 days for dressing change, suture removal, and advancement to an exercise program. DD:  05/08/01 TD:  05/08/01 Job: [id number]

## 2011-03-14 ENCOUNTER — Encounter: Payer: Self-pay | Admitting: Internal Medicine

## 2011-04-05 ENCOUNTER — Telehealth: Payer: Self-pay | Admitting: Internal Medicine

## 2011-04-05 NOTE — Telephone Encounter (Addendum)
Patient needs some labs drawn here for [facility].  She was contacted today by Dr Abigail Miyamoto and has been on Lovenox and they need to check her blood levels.  She is not sure exactly what labs they are needing. She is to have their office contact me.

## 2011-04-05 NOTE — Telephone Encounter (Signed)
I spoke with the patient again and they need her monitored for 3 months.  They are going to arrange to have her go to a local coumadin clinic.  She thanked me for our time, but no labs are needed today

## 2011-04-07 ENCOUNTER — Telehealth: Payer: Self-pay | Admitting: Internal Medicine

## 2011-04-07 NOTE — Telephone Encounter (Signed)
Left message for patient to call back  

## 2011-04-07 NOTE — Telephone Encounter (Signed)
Patient was calling to inquire if we could maintain her coumadin and continue to check her coumadin levels for the next 3 months.  I did advise her that we don't have a coumadin clinic here in [place], she is advised to try and contact her primary care MD that is [facility].

## 2011-06-04 ENCOUNTER — Other Ambulatory Visit: Payer: Self-pay | Admitting: Internal Medicine

## 2011-08-17 HISTORY — PX: ILEOSTOMY CLOSURE: SHX1784

## 2011-08-25 ENCOUNTER — Encounter: Payer: Self-pay | Admitting: Internal Medicine

## 2011-10-22 IMAGING — CR DG ABDOMEN 1V
1 series · 1 of 1 positions shown · non-contrast
Comparison: None

CLINICAL DATA: Evaluate for pancreatic stent

ABDOMEN - 1 VIEW

[view not recorded]
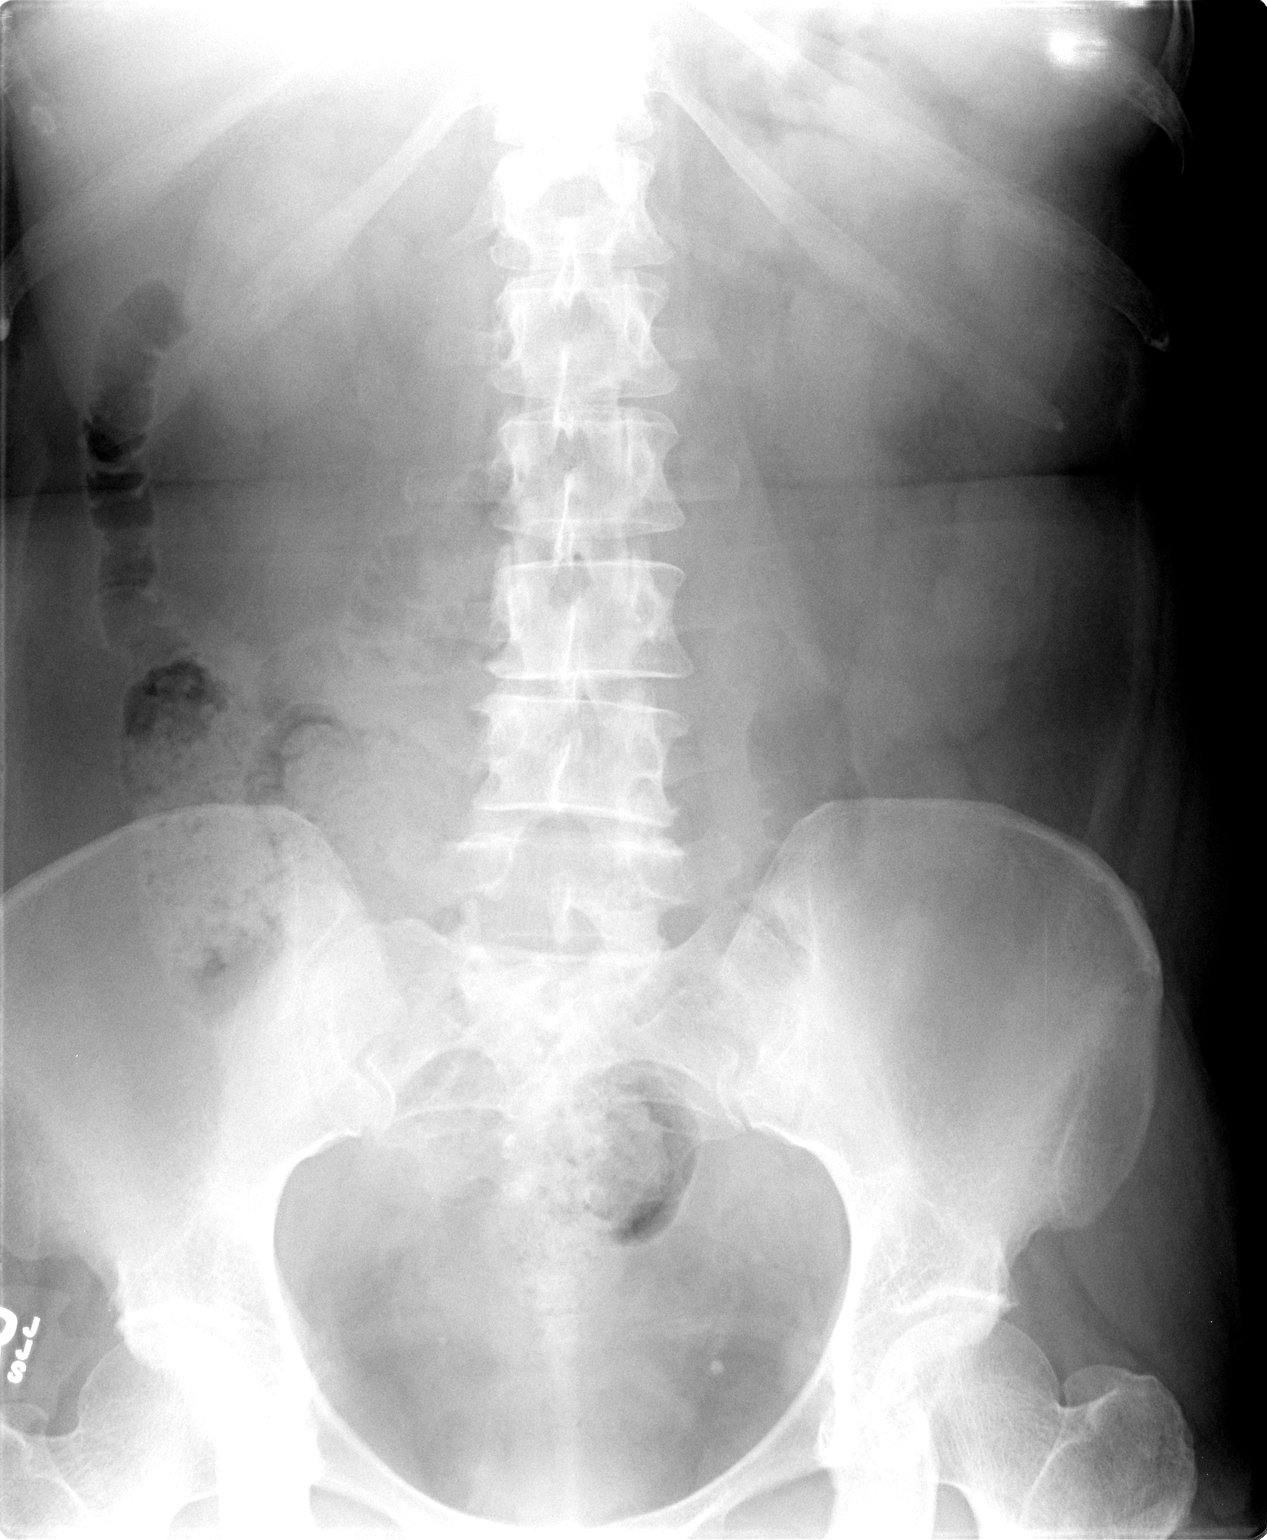

[1 of 1 positions shown; findings below may reference images not displayed]

FINDINGS: Normal bowel gas pattern.  Negative for bowel
obstruction.

Negative for pancreatic stent in the abdomen.

No focal bony abnormality.  No renal calculus.
IMPRESSION: Negative for pancreatic stent.  No acute abnormality.

## 2012-02-15 DIAGNOSIS — I2699 Other pulmonary embolism without acute cor pulmonale: Secondary | ICD-10-CM

## 2012-02-15 HISTORY — DX: Other pulmonary embolism without acute cor pulmonale: I26.99

## 2012-07-09 ENCOUNTER — Encounter: Payer: Self-pay | Admitting: Internal Medicine

## 2012-07-27 ENCOUNTER — Other Ambulatory Visit: Payer: Self-pay | Admitting: Internal Medicine

## 2012-10-25 ENCOUNTER — Other Ambulatory Visit: Payer: Self-pay | Admitting: Obstetrics and Gynecology

## 2012-10-25 DIAGNOSIS — R928 Other abnormal and inconclusive findings on diagnostic imaging of breast: Secondary | ICD-10-CM

## 2012-11-02 ENCOUNTER — Ambulatory Visit
Admission: RE | Admit: 2012-11-02 | Discharge: 2012-11-02 | Disposition: A | Discharge status: Home / Self Care | Payer: Self-pay | Source: Ambulatory Visit | Attending: Obstetrics and Gynecology | Admitting: Obstetrics and Gynecology

## 2012-11-02 DIAGNOSIS — R928 Other abnormal and inconclusive findings on diagnostic imaging of breast: Secondary | ICD-10-CM

## 2013-04-11 ENCOUNTER — Other Ambulatory Visit: Payer: Self-pay

## 2013-04-11 MED ORDER — LANSOPRAZOLE 30 MG PO CPDR: DELAYED_RELEASE_CAPSULE | ORAL | Status: DC

## 2013-04-11 NOTE — Telephone Encounter (Signed)
Received prior authorization request for prevacid, denied.  Last seen 2012.  Needs office visit .

## 2013-07-31 ENCOUNTER — Other Ambulatory Visit: Payer: Self-pay | Admitting: Internal Medicine

## 2013-08-05 ENCOUNTER — Ambulatory Visit: Payer: Self-pay | Admitting: Podiatry

## 2013-10-28 ENCOUNTER — Other Ambulatory Visit: Payer: Self-pay | Admitting: Obstetrics and Gynecology

## 2013-10-28 DIAGNOSIS — N649 Disorder of breast, unspecified: Secondary | ICD-10-CM

## 2013-11-04 ENCOUNTER — Ambulatory Visit
Admission: RE | Admit: 2013-11-04 | Discharge: 2013-11-04 | Disposition: A | Discharge status: Home / Self Care | Payer: BC Managed Care – PPO | Source: Ambulatory Visit | Attending: Obstetrics and Gynecology | Admitting: Obstetrics and Gynecology

## 2013-11-04 DIAGNOSIS — N649 Disorder of breast, unspecified: Secondary | ICD-10-CM

## 2014-07-24 ENCOUNTER — Telehealth: Payer: Self-pay | Admitting: Internal Medicine

## 2014-07-24 NOTE — Telephone Encounter (Signed)
I spoke with the patient she will have Dr. Vickki Hearing send records for her upcoming appt with Dr. Carlean Purl

## 2014-07-24 NOTE — Telephone Encounter (Signed)
Patient has been going to [facility] for annual colon and EGD for polyposis.  Wants to try and come here for the procedure.  She has had resection in the past of the colon.  She reports she needs a special scope to perform the procedures here.  She will come in and discuss on 09/23/14 10:45

## 2014-07-24 NOTE — Telephone Encounter (Signed)
Left message for patient to call back  

## 2014-08-20 ENCOUNTER — Other Ambulatory Visit: Payer: Self-pay | Admitting: Obstetrics and Gynecology

## 2014-08-20 ENCOUNTER — Other Ambulatory Visit: Payer: Self-pay

## 2014-08-20 DIAGNOSIS — Z1239 Encounter for other screening for malignant neoplasm of breast: Secondary | ICD-10-CM

## 2014-09-03 ENCOUNTER — Telehealth: Payer: Self-pay | Admitting: Internal Medicine

## 2014-09-03 NOTE — Telephone Encounter (Signed)
Re'cd records from [facility] [place] [facility] to Dr.Gessner

## 2014-09-23 ENCOUNTER — Ambulatory Visit (INDEPENDENT_AMBULATORY_CARE_PROVIDER_SITE_OTHER): Payer: BC Managed Care – PPO | Admitting: Internal Medicine

## 2014-09-23 ENCOUNTER — Encounter: Payer: Self-pay | Admitting: Internal Medicine

## 2014-09-23 DIAGNOSIS — K317 Polyp of stomach and duodenum: Secondary | ICD-10-CM

## 2014-09-23 DIAGNOSIS — D126 Benign neoplasm of colon, unspecified: Secondary | ICD-10-CM

## 2014-09-23 HISTORY — DX: Polyp of stomach and duodenum: K31.7

## 2014-09-23 NOTE — Assessment & Plan Note (Signed)
Has gastric adenomas, one 2-3 cm - plan to snare when she comes for EGD The risks and benefits as well as alternatives of endoscopic procedure(s) have been discussed and reviewed. All questions answered. The patient agrees to proceed.

## 2014-09-23 NOTE — Assessment & Plan Note (Addendum)
Needs surveillance egd and sigmoidoscopy - needs exam of papilla with duodenoscopy also - may start with that scope. The risks and benefits as well as alternatives of endoscopic procedure(s) have been discussed and reviewed. All questions answered. The patient agrees to proceed.

## 2014-09-23 NOTE — Patient Instructions (Addendum)
You have been scheduled for an endoscopy and flex sig. Please follow the written instructions given to you at your visit today. Please pick up your prep supplies at the pharmacy. If you use inhalers (even only as needed), please bring them with you on the day of your procedure.   I appreciate the opportunity to care for you.

## 2014-09-23 NOTE — Progress Notes (Signed)
 Subjective:    Patient ID: Emma Lutz, female    DOB: 04/07/1956, 58 y.o.   MRN: [id number]  HPI The patient is a pleasant woman I know from previous air, she has familial adenomatous polyposis. She has both upper GI polyps and had rectal polyposis after a subtotal colectomy. She has had a snare ampullectomy cousin an adenoma. She has been followed at [facility] since 2012 but he cousin logistical issues in part because of her husband's death it is difficult to get her care up there and have appropriate transportation. She would like to have her surveillance upper endoscopy and proctoscopy/sigmoidoscopy performed here. Records reviewed show that she had a sigmoidoscopy with removal of one 2 mm polyp in the rectum that was not an adenomatous polyp it was really granulation tissue, and then she had tubular adenomas in the stomach biopsied for largest of which with a soft friable polypoid lesion 2-3 cm. There were no signs of polyps in the post ampullectomy region, there were a few 2 mm sessile polyps in the second portion of the duodenum. Dr. Blanch planned to remove the polyp in the distal stomach on the next exam 1 year from then. She is doing well after her proctectomy and J-pouch anastomosis though she had a stormy time with pancreatitis as well as a DVT and a pulmonary embolism. Things have smooth out overall. Allergies  Allergen Reactions  . Sulfonamide Derivatives     REACTION: rash   Outpatient Prescriptions Prior to Visit  Medication Sig Dispense Refill  . Calcium Carbonate-Vitamin D (CALTRATE 600+D) 600-400 MG-UNIT per tablet Take 1 tablet by mouth daily.      . celecoxib (CELEBREX) 400 MG capsule Take 400 mg by mouth 2 (two) times daily.      . lansoprazole (PREVACID) 30 MG capsule Denied, needs appointment 1 capsule 0  . PARoxetine (PAXIL-CR) 12.5 MG 24 hr tablet Take 12.5 mg by mouth every morning.      . Multiple Vitamin (MULTIVITAMIN PO) Take 1 tablet by mouth daily.      .  oxycodone (OXY-IR) 5 MG capsule Take 5 mg by mouth. As needed for pain      No facility-administered medications prior to visit.   Past Medical History  Diagnosis Date  . Familial adenomatous polyposis     subtotal colectomy, proctectomy (5/12),  duodenal and gastric adenomas, followed by [facility] (Branch)  . Pancreatitis   . GERD (gastroesophageal reflux disease)   . Arthritis   . Anxiety   . 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 Sessile rectal polyp   . Tubular adenoma     gastric, multiple   Past Surgical History  Procedure Laterality Date  . Rotator cuff repair  1990    right  . Carpal tunnel release      LEFT  . Tubal ligation    . Elbow surgery      RIGHT  . Lumbar spine surgery      X 2  . Proctectomy  02/16/11    with loop ileostomy Drt. Thacker [facility]  . Total colectomy  1976    with ileostomy/ileo-rectal anastomosis  . Ileostomy closure  08/17/11    Dr. Thacker [facility]  . Flexible sigmoidoscopy    . Esophagogastroduodenoscopy     History   Social History  . Marital Status: Married    Spouse Name: N/A    Number of Children: 2  . Years of Education: N/A   Occupational History  .   employed [employer]   Social History Main Topics  . Smoking status: Never Smoker   . Smokeless tobacco: Never Used  . Alcohol Use: No  . Drug Use: No   Social History Narrative   The patient is widowed she has 2 daughters and grandchildren that live in    She works as a computer tech support person   2-3 caffeinated beverages daily   Family History  Problem Relation Age of Onset  . Colon cancer Father   . Familial polyposis Father      Review of Systems As above, all other ROS negative    Objective:   Physical Exam Obese middle-aged white woman in no acute distress Lungs clear Heart S1-S2 no rubs or murmurs or gallops Abdomen is soft and nontender there are multiple surgical scars Extremities show no edema She is alert and oriented 3      Assessment & Plan:  POLYPOSIS, FAMILIAL ADENOMATOUS Needs surveillance egd and sigmoidoscopy - needs exam of papilla with duodenoscopy also - may start with that scope. The risks and benefits as well as alternatives of endoscopic procedure(s) have been discussed and reviewed. All questions answered. The patient agrees to proceed.   Gastric polyps Has gastric adenomas, one 2-3 cm - plan to snare when she comes for EGD The risks and benefits as well as alternatives of endoscopic procedure(s) have been discussed and reviewed. All questions answered. The patient agrees to proceed.   cc: Dr. Stan Branch  

## 2014-09-30 ENCOUNTER — Encounter (HOSPITAL_COMMUNITY): Payer: Self-pay | Admitting: *Deleted

## 2014-10-15 ENCOUNTER — Telehealth: Payer: Self-pay | Admitting: Internal Medicine

## 2014-10-15 NOTE — Telephone Encounter (Signed)
Left a message for patient to call back. 

## 2014-10-15 NOTE — Telephone Encounter (Signed)
Patient given recommendation. 

## 2014-10-15 NOTE — Telephone Encounter (Signed)
She is correct and does not need that - just an enema

## 2014-10-15 NOTE — Telephone Encounter (Signed)
Patient calling to be sure that Dr. Carlean Purl wants her to drink the Magnesium citrate for her prep since she does not have a large colon. Please, advise.

## 2014-10-15 NOTE — Anesthesia Preprocedure Evaluation (Addendum)
Anesthesia Evaluation  Patient identified by MRN, date of birth, ID band Patient awake    Reviewed: Allergy & Precautions, H&P , NPO status , Patient's Chart, lab work & pertinent test results, reviewed documented beta blocker date and time   History of Anesthesia Complications (+) PONV  Airway Mallampati: II   Neck ROM: Full    Dental  (+) Partial Upper, Dental Advisory Given, Teeth Intact   Pulmonary  breath sounds clear to auscultation        Cardiovascular negative cardio ROS  Rhythm:Regular     Neuro/Psych Anxiety    GI/Hepatic GERD-  Medicated,Familial polyposis   Endo/Other    Renal/GU      Musculoskeletal   Abdominal (+) + obese,   Peds  Hematology   Anesthesia Other Findings   Reproductive/Obstetrics                            Anesthesia Physical Anesthesia Plan  ASA: II  Anesthesia Plan: MAC   Post-op Pain Management:    Induction: Intravenous  Airway Management Planned: Nasal Cannula  Additional Equipment:   Intra-op Plan:   Post-operative Plan:   Informed Consent: I have reviewed the patients History and Physical, chart, labs and discussed the procedure including the risks, benefits and alternatives for the proposed anesthesia with the patient or authorized representative who has indicated his/her understanding and acceptance.     Plan Discussed with:   Anesthesia Plan Comments:         Anesthesia Quick Evaluation

## 2014-10-16 ENCOUNTER — Ambulatory Visit (HOSPITAL_COMMUNITY): Payer: BC Managed Care – PPO | Admitting: Anesthesiology

## 2014-10-16 ENCOUNTER — Ambulatory Visit (HOSPITAL_COMMUNITY)
Admission: RE | Admit: 2014-10-16 | Discharge: 2014-10-16 | Disposition: A | Discharge status: Home / Self Care | Payer: BC Managed Care – PPO | Source: Ambulatory Visit | Attending: Internal Medicine | Admitting: Internal Medicine

## 2014-10-16 ENCOUNTER — Encounter (HOSPITAL_COMMUNITY)
Admission: RE | Disposition: A | Discharge status: Home / Self Care | Payer: Self-pay | Source: Ambulatory Visit | Attending: Internal Medicine

## 2014-10-16 ENCOUNTER — Encounter (HOSPITAL_COMMUNITY): Payer: Self-pay

## 2014-10-16 DIAGNOSIS — K219 Gastro-esophageal reflux disease without esophagitis: Secondary | ICD-10-CM | POA: Diagnosis not present

## 2014-10-16 DIAGNOSIS — D1391 Familial adenomatous polyposis: Secondary | ICD-10-CM | POA: Insufficient documentation

## 2014-10-16 DIAGNOSIS — K317 Polyp of stomach and duodenum: Secondary | ICD-10-CM | POA: Diagnosis present

## 2014-10-16 DIAGNOSIS — D128 Benign neoplasm of rectum: Secondary | ICD-10-CM | POA: Insufficient documentation

## 2014-10-16 DIAGNOSIS — Z8719 Personal history of other diseases of the digestive system: Secondary | ICD-10-CM

## 2014-10-16 DIAGNOSIS — Z86711 Personal history of pulmonary embolism: Secondary | ICD-10-CM | POA: Diagnosis not present

## 2014-10-16 DIAGNOSIS — F419 Anxiety disorder, unspecified: Secondary | ICD-10-CM | POA: Diagnosis not present

## 2014-10-16 DIAGNOSIS — Z882 Allergy status to sulfonamides status: Secondary | ICD-10-CM | POA: Diagnosis not present

## 2014-10-16 DIAGNOSIS — Z800 Family history of malignant neoplasm of digestive organs: Secondary | ICD-10-CM | POA: Diagnosis not present

## 2014-10-16 DIAGNOSIS — D126 Benign neoplasm of colon, unspecified: Secondary | ICD-10-CM | POA: Insufficient documentation

## 2014-10-16 HISTORY — DX: Nausea with vomiting, unspecified: R11.2

## 2014-10-16 HISTORY — PX: ESOPHAGOGASTRODUODENOSCOPY: SHX5428

## 2014-10-16 HISTORY — DX: Anemia, unspecified: D64.9

## 2014-10-16 HISTORY — DX: Other specified postprocedural states: Z98.890

## 2014-10-16 HISTORY — PX: FLEXIBLE SIGMOIDOSCOPY: SHX5431

## 2014-10-16 SURGERY — EGD (ESOPHAGOGASTRODUODENOSCOPY)
Anesthesia: Monitor Anesthesia Care

## 2014-10-16 MED ORDER — LACTATED RINGERS IV SOLN
INTRAVENOUS | Status: DC | PRN
  Administered 2014-10-16: 10:00:00 via INTRAVENOUS

## 2014-10-16 MED ORDER — LACTATED RINGERS IV SOLN
INTRAVENOUS | Status: DC
  Administered 2014-10-16: 1000 mL via INTRAVENOUS

## 2014-10-16 MED ORDER — PROPOFOL 10 MG/ML IV BOLUS
INTRAVENOUS | Status: AC
  Filled 2014-10-16: qty 20

## 2014-10-16 MED ORDER — PROPOFOL 10 MG/ML IV BOLUS
INTRAVENOUS | Status: AC
  Filled 2014-10-16: qty 40

## 2014-10-16 MED ORDER — PROPOFOL 10 MG/ML IV BOLUS
INTRAVENOUS | Status: DC | PRN
  Administered 2014-10-16 (×2): 50 mg via INTRAVENOUS
  Administered 2014-10-16 (×2): 100 mg via INTRAVENOUS
  Administered 2014-10-16 (×4): 50 mg via INTRAVENOUS

## 2014-10-16 MED ORDER — LACTATED RINGERS IV SOLN: 1000.0000 mL | INTRAVENOUS | Status: DC

## 2014-10-16 MED ORDER — SODIUM CHLORIDE 0.9 % IV SOLN: INTRAVENOUS | Status: DC

## 2014-10-16 NOTE — H&P (View-Only) (Signed)
Subjective:    Patient ID: Emma Lutz, female    DOB: 05-21-1956, 58 y.o.   MRN: [id number]  HPI The patient is a pleasant woman I know from previous air, she has familial adenomatous polyposis. She has both upper GI polyps and had rectal polyposis after a subtotal colectomy. She has had a snare ampullectomy cousin an adenoma. She has been followed at [facility] since 2012 but he cousin logistical issues in part because of her husband's death it is difficult to get her care up there and have appropriate transportation. She would like to have her surveillance upper endoscopy and proctoscopy/sigmoidoscopy performed here. Records reviewed show that she had a sigmoidoscopy with removal of one 2 mm polyp in the rectum that was not an adenomatous polyp it was really granulation tissue, and then she had tubular adenomas in the stomach biopsied for largest of which with a soft friable polypoid lesion 2-3 cm. There were no signs of polyps in the post ampullectomy region, there were a few 2 mm sessile polyps in the second portion of the duodenum. Dr. Patric Dykes planned to remove the polyp in the distal stomach on the next exam 1 year from then. She is doing well after her proctectomy and J-pouch anastomosis though she had a stormy time with pancreatitis as well as a DVT and a pulmonary embolism. Things have smooth out overall. Allergies  Allergen Reactions  . Sulfonamide Derivatives     REACTION: rash   Outpatient Prescriptions Prior to Visit  Medication Sig Dispense Refill  . Calcium Carbonate-Vitamin D (CALTRATE 600+D) 600-400 MG-UNIT per tablet Take 1 tablet by mouth daily.      . celecoxib (CELEBREX) 400 MG capsule Take 400 mg by mouth 2 (two) times daily.      . lansoprazole (PREVACID) 30 MG capsule Denied, needs appointment 1 capsule 0  . PARoxetine (PAXIL-CR) 12.5 MG 24 hr tablet Take 12.5 mg by mouth every morning.      . Multiple Vitamin (MULTIVITAMIN PO) Take 1 tablet by mouth daily.      Marland Kitchen  oxycodone (OXY-IR) 5 MG capsule Take 5 mg by mouth. As needed for pain      No facility-administered medications prior to visit.   Past Medical History  Diagnosis Date  . Familial adenomatous polyposis     subtotal colectomy, proctectomy (5/12),  duodenal and gastric adenomas, followed by [facility]  . Pancreatitis   . GERD (gastroesophageal reflux disease)   . Arthritis   . Anxiety   . 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 Sessile rectal polyp   . Tubular adenoma     gastric, multiple   Past Surgical History  Procedure Laterality Date  . Rotator cuff repair  1990    right  . Carpal tunnel release      LEFT  . Tubal ligation    . Elbow surgery      RIGHT  . Lumbar spine surgery      X 2  . Proctectomy  02/16/11    with loop ileostomy [facility]rt. [place]  . Total colectomy  1976    with ileostomy/ileo-rectal anastomosis  . Ileostomy closure  08/17/11    Dr. Brooke Pace  . Flexible sigmoidoscopy    . Esophagogastroduodenoscopy     History   Social History  . Marital Status: Married    Spouse Name: N/A    Number of Children: 2  . Years of Education: N/A   Occupational History  .  employed [employer]   Social History Main Topics  . Smoking status: Never Smoker   . Smokeless tobacco: Never Used  . Alcohol Use: No  . Drug Use: No   Social History Narrative   The patient is widowed she has 2 daughters and grandchildren that live in [place]   She works as a Hotel manager support person   2-3 caffeinated beverages daily   Family History  Problem Relation Age of Onset  . Colon cancer Father   . Familial polyposis Father      Review of Systems As above, all other ROS negative    Objective:   Physical Exam Obese middle-aged white woman in no acute distress Lungs clear Heart S1-S2 no rubs or murmurs or gallops Abdomen is soft and nontender there are multiple surgical scars Extremities show no edema She is alert and oriented 3      Assessment & Plan:  POLYPOSIS, FAMILIAL ADENOMATOUS Needs surveillance egd and sigmoidoscopy - needs exam of papilla with duodenoscopy also - may start with that scope. The risks and benefits as well as alternatives of endoscopic procedure(s) have been discussed and reviewed. All questions answered. The patient agrees to proceed.   Gastric polyps Has gastric adenomas, one 2-3 cm - plan to snare when she comes for EGD The risks and benefits as well as alternatives of endoscopic procedure(s) have been discussed and reviewed. All questions answered. The patient agrees to proceed.   cc: Dr. Obie Dredge

## 2014-10-16 NOTE — Transfer of Care (Signed)
Immediate Anesthesia Transfer of Care Note  Patient: Emma Lutz  Procedure(s) Performed: Procedure(s) with comments: ESOPHAGOGASTRODUODENOSCOPY (EGD) (N/A) - need ercp scope FLEXIBLE SIGMOIDOSCOPY (N/A)  Patient Location: PACU  Anesthesia Type:MAC  Level of Consciousness: awake, sedated and patient cooperative  Airway & Oxygen Therapy: Patient Spontanous Breathing and Patient connected to face mask oxygen  Post-op Assessment: Report given to PACU RN and Post -op Vital signs reviewed and stable  Post vital signs: Reviewed and stable  Complications: No apparent anesthesia complications

## 2014-10-16 NOTE — Discharge Instructions (Signed)
° °  Please review the procedure reports for the findings.  I can refill your Celebrex when needed.  I appreciate the opportunity to care for you. Gatha Mayer, MD, FACG  YOU HAD AN ENDOSCOPIC PROCEDURE TODAY: Refer to the procedure report and other information in the discharge instructions given to you for any specific questions about what was found during the examination. If this information does not answer your questions, please call Dr. Celesta Aver office at [phone] to clarify.   YOU SHOULD EXPECT: Some feelings of bloating in the abdomen. Passage of more gas than usual. Walking can help get rid of the air that was put into your GI tract during the procedure and reduce the bloating. If you had a lower endoscopy (such as a colonoscopy or flexible sigmoidoscopy) you may notice spotting of blood in your stool or on the toilet paper. Some abdominal soreness may be present for a day or two, also.  DIET: Your first meal following the procedure should be a light meal and then it is ok to progress to your normal diet. A half-sandwich or bowl of soup is an example of a good first meal. Heavy or fried foods are harder to digest and may make you feel nauseous or bloated. Drink plenty of fluids but you should avoid alcoholic beverages for 24 hours.   ACTIVITY: Your care partner should take you home directly after the procedure. You should plan to take it easy, moving slowly for the rest of the day. You can resume normal activity the day after the procedure however YOU SHOULD NOT DRIVE, use power tools, machinery or perform tasks that involve climbing or major physical exertion for 24 hours (because of the sedation medicines used during the test).   SYMPTOMS TO REPORT IMMEDIATELY: A gastroenterologist can be reached at any hour. Please call [phone]  for any of the following symptoms:  Following lower endoscopy (colonoscopy, flexible sigmoidoscopy) Excessive amounts of blood in the stool    Significant tenderness, worsening of abdominal pains  Swelling of the abdomen that is new, acute  Fever of 100 or higher  Following upper endoscopy (EGD, EUS, ERCP, esophageal dilation) Vomiting of blood or coffee ground material  New, significant abdominal pain  New, significant chest pain or pain under the shoulder blades  Painful or persistently difficult swallowing  New shortness of breath  Black, tarry-looking or red, bloody stools  FOLLOW UP:  If any biopsies were taken you will be contacted by phone or by letter within the next 1-3 weeks. Call [phone]  if you have not heard about the biopsies in 3 weeks.  Please also call with any specific questions about appointments or follow up tests.

## 2014-10-16 NOTE — Interval H&P Note (Signed)
History and Physical Interval Note:  10/16/2014 9:51 AM  Emma Lutz  has presented today for surgery, with the diagnosis of gastric polyps, FAP  The various methods of treatment have been discussed with the patient and family. After consideration of risks, benefits and other options for treatment, the patient has consented to  Procedure(s) with comments: ESOPHAGOGASTRODUODENOSCOPY (EGD) (N/A) - need ercp scope FLEXIBLE SIGMOIDOSCOPY (N/A) as a surgical intervention .  The patient's history has been reviewed, patient examined, no change in status, stable for surgery.  I have reviewed the patient's chart and labs.  Questions were answered to the patient's satisfaction.     Silvano Rusk

## 2014-10-16 NOTE — Op Note (Addendum)
[facility] [place], [address]   ENDOSCOPY PROCEDURE REPORT  PATIENT: Emma, Lutz  MR#: [id number] BIRTHDATE: Dec 10, 1955 , 40  yrs. old GENDER: female ENDOSCOPIST: Gatha Mayer, MD, [facility] PROCEDURE DATE:  10/16/2014 PROCEDURE:  EGD w/ biopsy ASA CLASS:     Class II INDICATIONS:  surveillance of polyposis syndrome. MEDICATIONS: Monitored anesthesia care and Per Anesthesia TOPICAL ANESTHETIC: Cetacaine Spray DESCRIPTION OF PROCEDURE: After the risks benefits and alternatives of the procedure were thoroughly explained, informed consent was obtained.  The Pentax Gastroscope [id number] endoscope was introduced through the mouth and advanced to the second portion of the duodenum , Without limitations.  The instrument was slowly withdrawn as the mucosa was fully examined.  1) Diffuse carpet of polyps in cardia, fundud and body of stomach. Fleshy and soft, some friable.  Multiple biopsies taken. 2) A few diminutive polyps in the antrum - no dominant lesion or anything larger than 5 mm seen. 3) A few diminutive duodenal polyps seen 4) Ampulla area and medial duodenal wall without polyps as seen by duodenoscope inserted after gastroscope used.  Retroflexed views revealed as previously described.     The scope was then withdrawn from the patient and the procedure completed. COMPLICATIONS: There were no immediate complications. ENDOSCOPIC IMPRESSION: 1) Diffuse carpet of polyps in cardia, fundud and body of stomach. Fleshy and soft, some friable.  Multiple biopsies taken. 2) A few diminutive polyps in the antrum - no dominant lesion or anything larger than 5 mm seen. 3) A few diminutive duodenal polyps seen 4) Ampulla area and medial duodenal wall without polyps as seen by duodenoscope inserted after gastroscope used  RECOMMENDATIONS: Await pathology results   - note I did not see a 2-3 cm antral polyp as was described at [place] last year - ?  regression or typographical error  eSigned:  Gatha Mayer, MD, [facility] 10/16/2014 11:02 AM Revised: 10/16/2014 11:02 AMCC: Obie Dredge, MD (by mail) and The Patient

## 2014-10-16 NOTE — Op Note (Signed)
[facility] [place], [address]   FLEXIBLE SIGMOIDOSCOPY PROCEDURE REPORT  PATIENT: Emma Lutz, Emma Lutz  MR#: [id number] BIRTHDATE: [birth year] , 27  yrs. old GENDER: female ENDOSCOPIST: Gatha Mayer, MD, [facility] REFERRED BY: PROCEDURE DATE:  10/16/2014 PROCEDURE:   Sigmoidoscopy with biopsy ASA CLASS:   Class II INDICATIONS:polyposis coli surveillance. MEDICATIONS: Residual sedation present, Monitored anesthesia care, and Per Anesthesia  DESCRIPTION OF PROCEDURE:   After the risks benefits and alternatives of the procedure were thoroughly explained, informed consent was obtained.  Digital exam revealed no abnormalities of the rectum. The     endoscope was introduced through the anus  and advanced to the surgical anastomosis , The exam was Without limitations.    The quality of the prep was The overall prep quality was good. .  The instrument was then slowly withdrawn as the mucosa was fully examined.         COLON FINDINGS: J pouch surgery changes with suture material seen - all looked normal.  In the very distal aspect of "rectal area" some possible polypoid changes seen and biopsied.    Retroflexion was not performed.    The scope was then withdrawn from the patient and the procedure terminated.  COMPLICATIONS: There were no immediate complications.  ENDOSCOPIC IMPRESSION: J pouch surgery changes with suture material seen - all looked normal.  In the very distal aspect of "rectal area" some possible polypoid changes seen and biopsied  RECOMMENDATIONS: Await biopsy results    eSigned:  Gatha Mayer, MD, [facility] 10/16/2014 11:01 AM   CC: Obie Dredge, MD (by mail) and The Patient

## 2014-10-16 NOTE — Anesthesia Postprocedure Evaluation (Signed)
  Anesthesia Post-op Note  Patient: Emma Lutz  Procedure(s) Performed: Procedure(s) with comments: ESOPHAGOGASTRODUODENOSCOPY (EGD) (N/A) - need ercp scope FLEXIBLE SIGMOIDOSCOPY (N/A)  Patient Location: PACU  Anesthesia Type:MAC  Level of Consciousness: awake and alert   Airway and Oxygen Therapy: Patient Spontanous Breathing and Patient connected to nasal cannula oxygen  Post-op Pain: none  Post-op Assessment: Post-op Vital signs reviewed, Patient's Cardiovascular Status Stable, Respiratory Function Stable and Patent Airway  Post-op Vital Signs: Reviewed and stable  Last Vitals:  Filed Vitals:   10/16/14 0936  BP: 132/51  Pulse: 83  Temp: 36.9 C  Resp: 21    Complications: No apparent anesthesia complications

## 2014-10-17 ENCOUNTER — Encounter (HOSPITAL_COMMUNITY): Payer: Self-pay | Admitting: Internal Medicine

## 2014-10-20 ENCOUNTER — Encounter: Payer: Self-pay | Admitting: Internal Medicine

## 2014-10-23 ENCOUNTER — Encounter: Payer: Self-pay | Admitting: Internal Medicine

## 2014-10-23 NOTE — Progress Notes (Signed)
Quick Note:  Fundic gland gastric polyps Rectal adenoma  Repeat EGD and flex sig in 1 year ______

## 2014-10-29 ENCOUNTER — Other Ambulatory Visit: Payer: Self-pay

## 2014-10-29 ENCOUNTER — Other Ambulatory Visit: Payer: Self-pay | Admitting: Obstetrics and Gynecology

## 2014-10-29 ENCOUNTER — Ambulatory Visit
Admission: RE | Admit: 2014-10-29 | Discharge: 2014-10-29 | Disposition: A | Discharge status: Home / Self Care | Payer: BLUE CROSS/BLUE SHIELD | Source: Ambulatory Visit

## 2014-10-29 DIAGNOSIS — Z1231 Encounter for screening mammogram for malignant neoplasm of breast: Secondary | ICD-10-CM

## 2014-10-30 LAB — CYTOLOGY - PAP

## 2014-11-03 ENCOUNTER — Telehealth: Payer: Self-pay | Admitting: Internal Medicine

## 2014-11-03 NOTE — Telephone Encounter (Signed)
Ok to refill Sir? 

## 2014-11-04 MED ORDER — CELECOXIB 400 MG PO CAPS: 400.0000 mg | ORAL_CAPSULE | Freq: Two times a day (BID) | ORAL | Status: DC

## 2014-11-04 NOTE — Telephone Encounter (Signed)
Refill x 1 year 

## 2015-04-14 ENCOUNTER — Encounter: Payer: Self-pay | Admitting: Gastroenterology

## 2015-04-14 ENCOUNTER — Encounter: Payer: Self-pay | Admitting: Internal Medicine

## 2015-08-26 ENCOUNTER — Other Ambulatory Visit: Payer: Self-pay

## 2015-08-26 MED ORDER — LANSOPRAZOLE 30 MG PO CPDR: 30.0000 mg | DELAYED_RELEASE_CAPSULE | Freq: Every day | ORAL | Status: DC

## 2015-08-26 NOTE — Telephone Encounter (Signed)
Ok to refill per Dr Gessner. 

## 2015-09-01 ENCOUNTER — Telehealth: Payer: Self-pay | Admitting: Internal Medicine

## 2015-09-01 NOTE — Telephone Encounter (Signed)
Please advise Sir, thank you. 

## 2015-09-01 NOTE — Telephone Encounter (Signed)
Refill Celebrex as previously prescribed 1 year

## 2015-09-02 MED ORDER — CELECOXIB 400 MG PO CAPS: 400.0000 mg | ORAL_CAPSULE | Freq: Two times a day (BID) | ORAL | Status: DC

## 2015-09-02 NOTE — Telephone Encounter (Signed)
Patient informed that rx sent in as requested. 

## 2015-09-18 ENCOUNTER — Encounter: Payer: Self-pay | Admitting: Internal Medicine

## 2015-11-24 ENCOUNTER — Ambulatory Visit (INDEPENDENT_AMBULATORY_CARE_PROVIDER_SITE_OTHER): Payer: BLUE CROSS/BLUE SHIELD | Admitting: Internal Medicine

## 2015-11-24 ENCOUNTER — Encounter: Payer: Self-pay | Admitting: Internal Medicine

## 2015-11-24 VITALS — BP 130/80 | HR 100 | Ht 62.0 in | Wt 213.2 lb

## 2015-11-24 DIAGNOSIS — K317 Polyp of stomach and duodenum: Secondary | ICD-10-CM

## 2015-11-24 DIAGNOSIS — D126 Benign neoplasm of colon, unspecified: Secondary | ICD-10-CM

## 2015-11-24 NOTE — Progress Notes (Signed)
Subjective:    Patient ID: Emma Lutz, female    DOB: [birth year], 60 y.o.   MRN: [id number] Cc: polyposis f/u HPI  Here for f/u - has FAP Is s/p colectomy and then proctectomy but has some residual adenomatous changes in ano-rectal area  Also numerous gastric polyps and hx polyp at ampulla removed by snare ampullectomy. Distal gastric polyps with adenomas 2014 - not seen 2015.  No c/o today  Allergies  Allergen Reactions  . Sulfonamide Derivatives     REACTION: rash   Outpatient Prescriptions Prior to Visit  Medication Sig Dispense Refill  . celecoxib (CELEBREX) 400 MG capsule Take 1 capsule (400 mg total) by mouth 2 (two) times daily. 60 capsule 11  . lansoprazole (PREVACID) 30 MG capsule Take 1 capsule (30 mg total) by mouth daily. 30 capsule 2  . PARoxetine (PAXIL-CR) 25 MG 24 hr tablet Take 1 tablet by mouth daily.    Marland Kitchen acetaminophen (TYLENOL) 500 MG tablet Take 500-1,000 mg by mouth every 6 (six) hours as needed (Pain).    Marland Kitchen lactated ringers infusion Inject 1,000 mLs into the vein continuous. 250 mL 0   No facility-administered medications prior to visit.   Past Medical History  Diagnosis Date  . Familial adenomatous polyposis     subtotal colectomy, proctectomy (5/12),  duodenal and gastric adenomas, followed by [facility]  . Pancreatitis 2012  . GERD (gastroesophageal reflux disease)   . Arthritis   . Pulmonary embolism [facility]) May 2013    multiple  . Sessile rectal polyp   . Tubular adenoma     gastric, multiple  . Anemia     no recent iron use  . PONV (postoperative nausea and vomiting) 2012    nausea after last surgery   Past Surgical History  Procedure Laterality Date  . Rotator cuff repair Right 1990  . Carpal tunnel release Left   . Tubal ligation    . Elbow surgery Right   . Lumbar spine surgery      X 2  . Proctectomy  02/16/11    with loop ileostomy Drt. [place]  . Total colectomy  1976    with ileostomy/ileo-rectal anastomosis    . Ileostomy closure  08/17/11    Dr. Brooke Pace  . Flexible sigmoidoscopy    . Esophagogastroduodenoscopy    . Esophagogastroduodenoscopy N/A 10/16/2014    Procedure: ESOPHAGOGASTRODUODENOSCOPY (EGD);  Surgeon: Gatha Mayer, MD;  Location: [facility];  Service: Endoscopy;  Laterality: N/A;  need ercp scope  . Flexible sigmoidoscopy N/A 10/16/2014    Procedure: FLEXIBLE SIGMOIDOSCOPY;  Surgeon: Gatha Mayer, MD;  Location: [facility] ENDOSCOPY;  Service: Endoscopy;  Laterality: N/A;  . Appendectomy    . Hernia repair     Social History   Social History  . Marital Status: Married    Spouse Name: N/A  . Number of Children: 2  . Years of Education: N/A   Occupational History  . employed [employer]  . Scientist, clinical (histocompatibility and immunogenetics) support    Social History Main Topics  . Smoking status: Never Smoker   . Smokeless tobacco: Never Used  . Alcohol Use: No  . Drug Use: No  . Sexual Activity: Not Asked   Other Topics Concern  . None   Social History Narrative   The patient is widowed she has 2 daughters and grandchildren that live in [place]   She works as a Hotel manager support person   2-3 caffeinated beverages daily  Family History  Problem Relation Age of Onset  . Colon cancer Father   . Familial polyposis Father   . Esophageal cancer Father   . Heart disease Mother   . Brain cancer Sister    Review of Systems As above, sister has terminal brain cancer so stress with that    Objective:   Physical Exam @BP  130/80 mmHg  Pulse 100  Ht 5\' 2"  (1.575 m)  Wt 213 lb 4 oz (96.73 kg)  BMI 38.99 kg/m2@  General:  NAD Eyes:   anicteric Lungs:  clear Heart:: S1S2 no rubs, murmurs or gallops Abdomen:  soft and nontender, BS+ Ext:   no edema, cyanosis or clubbing    Data Reviewed:   As above    Assessment & Plan:   1. Familial polyposis   2. Gastric polyps    Plan for upper GI endoscopy including using the duodenoscope to look at the ampulla as well as  surveillance of the remaining rectal tissue and adenomatous changes there using a sigmoidoscope. This will be done at [facility] where I have the duodenoscope available. Anticipating March.The risks and benefits as well as alternatives of endoscopic procedure(s) have been discussed and reviewed. All questions answered. The patient agrees to proceed.

## 2015-11-24 NOTE — Patient Instructions (Signed)
  You have been scheduled for an endoscopy and flex sig. Please follow written instructions given to you at your visit today. If you use inhalers (even only as needed), please bring them with you on the day of your procedure. Your physician has requested that you go to www.startemmi.com and enter the access code given to you at your visit today. This web site gives a general overview about your procedure. However, you should still follow specific instructions given to you by our office regarding your preparation for the procedure.   Please use a fleets enema prior to coming for your procedure.     I appreciate the opportunity to care for you. Silvano Rusk, MD, [facility]

## 2015-12-18 ENCOUNTER — Encounter (HOSPITAL_COMMUNITY): Payer: Self-pay | Admitting: *Deleted

## 2015-12-19 ENCOUNTER — Other Ambulatory Visit: Payer: Self-pay | Admitting: Internal Medicine

## 2015-12-24 ENCOUNTER — Ambulatory Visit (HOSPITAL_COMMUNITY): Payer: BLUE CROSS/BLUE SHIELD | Admitting: Anesthesiology

## 2015-12-24 ENCOUNTER — Ambulatory Visit (HOSPITAL_COMMUNITY)
Admission: RE | Admit: 2015-12-24 | Discharge: 2015-12-24 | Disposition: A | Discharge status: Home / Self Care | Payer: BLUE CROSS/BLUE SHIELD | Source: Ambulatory Visit | Attending: Internal Medicine | Admitting: Internal Medicine

## 2015-12-24 ENCOUNTER — Encounter (HOSPITAL_COMMUNITY)
Admission: RE | Disposition: A | Discharge status: Home / Self Care | Payer: Self-pay | Source: Ambulatory Visit | Attending: Internal Medicine

## 2015-12-24 ENCOUNTER — Encounter (HOSPITAL_COMMUNITY): Payer: Self-pay

## 2015-12-24 DIAGNOSIS — Z09 Encounter for follow-up examination after completed treatment for conditions other than malignant neoplasm: Secondary | ICD-10-CM | POA: Insufficient documentation

## 2015-12-24 DIAGNOSIS — K219 Gastro-esophageal reflux disease without esophagitis: Secondary | ICD-10-CM | POA: Diagnosis not present

## 2015-12-24 DIAGNOSIS — D128 Benign neoplasm of rectum: Secondary | ICD-10-CM | POA: Diagnosis not present

## 2015-12-24 DIAGNOSIS — K317 Polyp of stomach and duodenum: Secondary | ICD-10-CM | POA: Diagnosis not present

## 2015-12-24 DIAGNOSIS — D126 Benign neoplasm of colon, unspecified: Secondary | ICD-10-CM | POA: Diagnosis not present

## 2015-12-24 DIAGNOSIS — Z86010 Personal history of colonic polyps: Secondary | ICD-10-CM | POA: Diagnosis not present

## 2015-12-24 DIAGNOSIS — Z9049 Acquired absence of other specified parts of digestive tract: Secondary | ICD-10-CM | POA: Diagnosis not present

## 2015-12-24 DIAGNOSIS — D131 Benign neoplasm of stomach: Secondary | ICD-10-CM | POA: Insufficient documentation

## 2015-12-24 DIAGNOSIS — M1990 Unspecified osteoarthritis, unspecified site: Secondary | ICD-10-CM | POA: Diagnosis not present

## 2015-12-24 DIAGNOSIS — K624 Stenosis of anus and rectum: Secondary | ICD-10-CM | POA: Diagnosis not present

## 2015-12-24 DIAGNOSIS — Z8719 Personal history of other diseases of the digestive system: Secondary | ICD-10-CM | POA: Insufficient documentation

## 2015-12-24 DIAGNOSIS — Z791 Long term (current) use of non-steroidal anti-inflammatories (NSAID): Secondary | ICD-10-CM | POA: Diagnosis not present

## 2015-12-24 DIAGNOSIS — K6389 Other specified diseases of intestine: Secondary | ICD-10-CM | POA: Diagnosis not present

## 2015-12-24 DIAGNOSIS — Z79899 Other long term (current) drug therapy: Secondary | ICD-10-CM | POA: Diagnosis not present

## 2015-12-24 DIAGNOSIS — Z932 Ileostomy status: Secondary | ICD-10-CM | POA: Diagnosis not present

## 2015-12-24 DIAGNOSIS — Z86711 Personal history of pulmonary embolism: Secondary | ICD-10-CM | POA: Insufficient documentation

## 2015-12-24 HISTORY — PX: FLEXIBLE SIGMOIDOSCOPY: SHX5431

## 2015-12-24 HISTORY — PX: ESOPHAGOGASTRODUODENOSCOPY: SHX5428

## 2015-12-24 SURGERY — EGD (ESOPHAGOGASTRODUODENOSCOPY)
Anesthesia: Monitor Anesthesia Care

## 2015-12-24 MED ORDER — ONDANSETRON HCL 4 MG/2ML IJ SOLN
INTRAMUSCULAR | Status: DC | PRN
  Administered 2015-12-24: 4 mg via INTRAVENOUS

## 2015-12-24 MED ORDER — PROPOFOL 10 MG/ML IV BOLUS
INTRAVENOUS | Status: AC
  Filled 2015-12-24: qty 20

## 2015-12-24 MED ORDER — ONDANSETRON HCL 4 MG/2ML IJ SOLN
INTRAMUSCULAR | Status: AC
  Filled 2015-12-24: qty 2

## 2015-12-24 MED ORDER — SODIUM CHLORIDE 0.9 % IV SOLN: INTRAVENOUS | Status: DC

## 2015-12-24 MED ORDER — PROPOFOL 500 MG/50ML IV EMUL
INTRAVENOUS | Status: DC | PRN
  Administered 2015-12-24: 250 ug/kg/min via INTRAVENOUS

## 2015-12-24 MED ORDER — LACTATED RINGERS IV SOLN
INTRAVENOUS | Status: DC
  Administered 2015-12-24: 1000 mL via INTRAVENOUS

## 2015-12-24 MED ORDER — PROPOFOL 10 MG/ML IV BOLUS
INTRAVENOUS | Status: AC
  Filled 2015-12-24: qty 40

## 2015-12-24 NOTE — Op Note (Signed)
[facility] Patient Name: Emma Lutz Procedure Date: 12/24/2015 MRN: [id number] Attending MD: Gatha Mayer , MD Date of Birth: 23-Dec-1955 CSN:  Age: 60 Admit Type: Outpatient Account #: [id number] Procedure:                Flexible Sigmoidoscopy Indications:              Personal history of colonic polyps, Personal                            history of rectal adenoma Providers:                Gatha Mayer, MD, Laverta Baltimore, RN, Cherylynn Ridges, Technician, Virgia Land, CRNA Referring MD:              Medicines:                Monitored Anesthesia Care Complications:            No immediate complications. Estimated Blood Loss:     Estimated blood loss: none. Procedure:                Pre-Anesthesia Assessment:                           - Prior to the procedure, a History and Physical                            was performed, and patient medications and                            allergies were reviewed. The patient's tolerance of                            previous anesthesia was also reviewed. The risks                            and benefits of the procedure and the sedation                            options and risks were discussed with the patient.                            All questions were answered, and informed consent                            was obtained. Prior Anticoagulants: The patient                            last took antiplatelet medication 1 day prior to                            the procedure. ASA Grade Assessment: III - A  patient with severe systemic disease. After                            reviewing the risks and benefits, the patient was                            deemed in satisfactory condition to undergo the                            procedure.                           - Prior to the procedure, a History and Physical                            was performed, and patient  medications and                            allergies were reviewed. The patient's tolerance of                            previous anesthesia was also reviewed. The risks                            and benefits of the procedure and the sedation                            options and risks were discussed with the patient.                            All questions were answered, and informed consent                            was obtained. Prior Anticoagulants: The patient                            last took antiplatelet medication 1 day prior to                            the procedure. ASA Grade Assessment: III - A                            patient with severe systemic disease. After                            reviewing the risks and benefits, the patient was                            deemed in satisfactory condition to undergo the                            procedure.  After obtaining informed consent, the scope was                            passed under direct vision. The Endoscope was                            introduced through the anus and advanced to the 20                            cm from the anal verge. The flexible sigmoidoscopy                            was accomplished with ease. The patient tolerated                            the procedure well. The quality of the bowel                            preparation was excellent. Scope In: Scope Out: Findings:      The perianal examination was normal.      The digital rectal exam findings include stenosis s/p proctectmy.      A few sessile polyps were found in the rectum. The polyps were 2 to 6 mm       in size. These were biopsied with a cold forceps for histology. Impression:               - Stenosis s/p proctectmy. found on digital rectal                            exam.                           - A few 2 to 6 mm polyps (adenomatous) in the                            rectum. Biopsied. Moderate  Sedation:      N/A- Per Anesthesia Care Recommendation:           - Await pathology results.                           - Patient has a contact number available for                            emergencies. The signs and symptoms of potential                            delayed complications were discussed with the                            patient. Return to normal activities tomorrow.                            Written discharge instructions were provided to the  patient.                           - Resume previous diet. Procedure Code(s):        --- Professional ---                           [phone], Sigmoidoscopy, flexible; with biopsy, single                            or multiple Diagnosis Code(s):        --- Professional ---                           D12.8, Benign neoplasm of rectum                           Z86.010, Personal history of colonic polyps                           Z86.018, Personal history of other benign neoplasm CPT copyright 2016 American Medical Association. All rights reserved. The codes documented in this report are preliminary and upon coder review may  be revised to meet current compliance requirements. Gatha Mayer, MD Gatha Mayer, MD 12/24/2015 12:54:24 PM Number of Addenda: 0

## 2015-12-24 NOTE — Discharge Instructions (Signed)
° °  YOU HAD AN ENDOSCOPIC PROCEDURE TODAY: Refer to the procedure report and other information in the discharge instructions given to you for any specific questions about what was found during the examination. If this information does not answer your questions, please call Dr. Million Maharaj's office at [phone] to clarify.   YOU SHOULD EXPECT: Some feelings of bloating in the abdomen. Passage of more gas than usual. Walking can help get rid of the air that was put into your GI tract during the procedure and reduce the bloating. If you had a lower endoscopy (such as a colonoscopy or flexible sigmoidoscopy) you may notice spotting of blood in your stool or on the toilet paper. Some abdominal soreness may be present for a day or two, also.  DIET: Your first meal following the procedure should be a light meal and then it is ok to progress to your normal diet. A half-sandwich or bowl of soup is an example of a good first meal. Heavy or fried foods are harder to digest and may make you feel nauseous or bloated. Drink plenty of fluids but you should avoid alcoholic beverages for 24 hours.   ACTIVITY: Your care partner should take you home directly after the procedure. You should plan to take it easy, moving slowly for the rest of the day. You can resume normal activity the day after the procedure however YOU SHOULD NOT DRIVE, use power tools, machinery or perform tasks that involve climbing or major physical exertion for 24 hours (because of the sedation medicines used during the test).   SYMPTOMS TO REPORT IMMEDIATELY: A gastroenterologist can be reached at any hour. Please call [phone]  for any of the following symptoms:  Following lower endoscopy (colonoscopy, flexible sigmoidoscopy) Excessive amounts of blood in the stool  Significant tenderness, worsening of abdominal pains  Swelling of the abdomen that is new, acute  Fever of 100 or higher  Following upper endoscopy (EGD, EUS, ERCP, esophageal  dilation) Vomiting of blood or coffee ground material  New, significant abdominal pain  New, significant chest pain or pain under the shoulder blades  Painful or persistently difficult swallowing  New shortness of breath  Black, tarry-looking or red, bloody stools  FOLLOW UP:  If any biopsies were taken you will be contacted by phone or by letter within the next 1-3 weeks. Call [phone]  if you have not heard about the biopsies in 3 weeks.  Please also call with any specific questions about appointments or follow up tests. 

## 2015-12-24 NOTE — Interval H&P Note (Signed)
History and Physical Interval Note:  12/24/2015 9:17 AM  Emma Lutz  has presented today for surgery, with the diagnosis of familial polyposis, gastric polyps  The various methods of treatment have been discussed with the patient and family. After consideration of risks, benefits and other options for treatment, the patient has consented to  Procedure(s) with comments: ESOPHAGOGASTRODUODENOSCOPY (EGD) (N/A) - May need ERCP scope FLEXIBLE SIGMOIDOSCOPY (N/A) as a surgical intervention .  The patient's history has been reviewed, patient examined, no change in status, stable for surgery.  I have reviewed the patient's chart and labs.  Questions were answered to the patient's satisfaction.     Silvano Rusk

## 2015-12-24 NOTE — Anesthesia Postprocedure Evaluation (Signed)
Anesthesia Post Note  Patient: Emma Lutz  Procedure(s) Performed: Procedure(s) (LRB): ESOPHAGOGASTRODUODENOSCOPY (EGD) (N/A) FLEXIBLE SIGMOIDOSCOPY (N/A)  Patient location during evaluation: PACU Anesthesia Type: MAC Level of consciousness: awake and alert Pain management: pain level controlled Vital Signs Assessment: post-procedure vital signs reviewed and stable Respiratory status: spontaneous breathing, nonlabored ventilation, respiratory function stable and patient connected to nasal cannula oxygen Cardiovascular status: blood pressure returned to baseline and stable Postop Assessment: no signs of nausea or vomiting Anesthetic complications: no    Last Vitals:  Filed Vitals:   12/24/15 1040 12/24/15 1050  BP: 126/70 128/56  Pulse: 85 94  Temp:    Resp: 24 19    Last Pain: There were no vitals filed for this visit.               Geraldy Akridge S

## 2015-12-24 NOTE — Op Note (Signed)
[facility] Patient Name: Emma Lutz Procedure Date: 12/24/2015 MRN: [id number] Attending MD: Gatha Mayer , MD Date of Birth: [birth year] CSN:  Age: 60 Admit Type: Outpatient Account #: [id number] Procedure:                Upper GI endoscopy Indications:              Surveillance procedure, Familial Adenomatous                            Polyposis Providers:                Gatha Mayer, MD, Laverta Baltimore, RN, Cherylynn Ridges, Technician, Ralene Bathe, Virgia Land, CRNA Referring MD:              Medicines:                Monitored Anesthesia Care Complications:            No immediate complications. Estimated Blood Loss:     Estimated blood loss: none. Procedure:                Pre-Anesthesia Assessment:                           - Prior to the procedure, a History and Physical                            was performed, and patient medications and                            allergies were reviewed. The patient's tolerance of                            previous anesthesia was also reviewed. The risks                            and benefits of the procedure and the sedation                            options and risks were discussed with the patient.                            All questions were answered, and informed consent                            was obtained. Prior Anticoagulants: The patient                            last took antiplatelet medication 1 day prior to                            the procedure. ASA Grade Assessment: III - A  patient with severe systemic disease. After                            reviewing the risks and benefits, the patient was                            deemed in satisfactory condition to undergo the                            procedure.                           After obtaining informed consent, the endoscope was                            passed under direct vision.  Throughout the                            procedure, the patient's blood pressure, pulse, and                            oxygen saturations were monitored continuously. The                            [id number]) scope was introduced through                            the mouth, and advanced to the second part of                            duodenum. The upper GI endoscopy was accomplished                            without difficulty. The patient tolerated the                            procedure well. Scope In: Scope Out: Findings:      The examined esophagus was normal.      Multiple 1 to 20 mm sessile polyps with no bleeding and no stigmata of       recent bleeding were found in the entire examined stomach. Biopsies were       taken with a cold forceps for histology. One polyp was violet colored -       nothing suspicious seen.      A few sessile polyps were found in the second portion of the duodenum.       The polyps were less than 5 mm in size. Ampulla seen with duodenoscope -       s/p ampullectomy      IMAGES WERE NOT CAPTURED Impression:               - Normal esophagus.                           - Multiple gastric polyps. Polyposis. Biopsied. No  suspicious lesions seen - there was one polyp that                            was violet in color.                           - Duodenal polyposis. Ampulla seen with                            duodenoscope - looked normal Moderate Sedation:      N/A- Per Anesthesia Care Recommendation:           - Patient has a contact number available for                            emergencies. The signs and symptoms of potential                            delayed complications were discussed with the                            patient. Return to normal activities tomorrow.                            Written discharge instructions were provided to the                            patient.                           -  Resume previous diet.                           - Continue present medications.                           - Await pathology results.                           - Repeat upper endoscopy in 1 year for surveillance                            based on pathology results (could be sooner) Procedure Code(s):        --- Professional ---                           [phone], Esophagogastroduodenoscopy, flexible,                            transoral; with biopsy, single or multiple Diagnosis Code(s):        --- Professional ---                           K31.7, Polyp of stomach and duodenum                           D12.6,  Benign neoplasm of colon, unspecified CPT copyright 2016 American Medical Association. All rights reserved. The codes documented in this report are preliminary and upon coder review may  be revised to meet current compliance requirements. Gatha Mayer, MD Gatha Mayer, MD 12/24/2015 12:42:01 PM Number of Addenda: 0

## 2015-12-24 NOTE — Transfer of Care (Signed)
Immediate Anesthesia Transfer of Care Note  Patient: Emma Lutz  Procedure(s) Performed: Procedure(s) with comments: ESOPHAGOGASTRODUODENOSCOPY (EGD) (N/A) - May need ERCP scope FLEXIBLE SIGMOIDOSCOPY (N/A)  Patient Location: PACU  Anesthesia Type:MAC  Level of Consciousness:  sedated, patient cooperative and responds to stimulation  Airway & Oxygen Therapy:Patient Spontanous Breathing and Patient connected to face mask oxgen  Post-op Assessment:  Report given to PACU RN and Post -op Vital signs reviewed and stable  Post vital signs:  Reviewed and stable  Last Vitals:  Filed Vitals:   12/24/15 0841 12/24/15 1033  BP: 131/84 117/68  Pulse: 94 79  Temp: 36.7 C 36.5 C  Resp: 15 34    Complications: No apparent anesthesia complications

## 2015-12-24 NOTE — Anesthesia Preprocedure Evaluation (Signed)
Anesthesia Evaluation  Patient identified by MRN, date of birth, ID band Patient awake    Reviewed: Allergy & Precautions, NPO status , Patient's Chart, lab work & pertinent test results  Airway Mallampati: II  TM Distance: >3 FB Neck ROM: Full    Dental no notable dental hx. (+) Partial Upper   Pulmonary neg pulmonary ROS,  Pulmonary embolism [facility]) May 2013 multiple     Pulmonary exam normal breath sounds clear to auscultation       Cardiovascular negative cardio ROS Normal cardiovascular exam Rhythm:Regular Rate:Normal     Neuro/Psych negative neurological ROS  negative psych ROS   GI/Hepatic Neg liver ROS, GERD  Medicated,  Endo/Other  negative endocrine ROS  Renal/GU negative Renal ROS  negative genitourinary   Musculoskeletal negative musculoskeletal ROS (+)   Abdominal   Peds negative pediatric ROS (+)  Hematology negative hematology ROS (+)   Anesthesia Other Findings   Reproductive/Obstetrics negative OB ROS                             Anesthesia Physical Anesthesia Plan  ASA: II  Anesthesia Plan: MAC   Post-op Pain Management:    Induction: Intravenous  Airway Management Planned: Nasal Cannula  Additional Equipment:   Intra-op Plan:   Post-operative Plan:   Informed Consent: I have reviewed the patients History and Physical, chart, labs and discussed the procedure including the risks, benefits and alternatives for the proposed anesthesia with the patient or authorized representative who has indicated his/her understanding and acceptance.   Dental advisory given  Plan Discussed with: CRNA and Surgeon  Anesthesia Plan Comments:         Anesthesia Quick Evaluation

## 2015-12-24 NOTE — H&P (View-Only) (Signed)
Subjective:    Patient ID: Emma Lutz, female    DOB: [birth year], 60 y.o.   MRN: [id number] Cc: polyposis f/u HPI  Here for f/u - has FAP Is s/p colectomy and then proctectomy but has some residual adenomatous changes in ano-rectal area  Also numerous gastric polyps and hx polyp at ampulla removed by snare ampullectomy. Distal gastric polyps with adenomas 2014 - not seen 2015.  No c/o today  Allergies  Allergen Reactions  . Sulfonamide Derivatives     REACTION: rash   Outpatient Prescriptions Prior to Visit  Medication Sig Dispense Refill  . celecoxib (CELEBREX) 400 MG capsule Take 1 capsule (400 mg total) by mouth 2 (two) times daily. 60 capsule 11  . lansoprazole (PREVACID) 30 MG capsule Take 1 capsule (30 mg total) by mouth daily. 30 capsule 2  . PARoxetine (PAXIL-CR) 25 MG 24 hr tablet Take 1 tablet by mouth daily.    Marland Kitchen acetaminophen (TYLENOL) 500 MG tablet Take 500-1,000 mg by mouth every 6 (six) hours as needed (Pain).    Marland Kitchen lactated ringers infusion Inject 1,000 mLs into the vein continuous. 250 mL 0   No facility-administered medications prior to visit.   Past Medical History  Diagnosis Date  . Familial adenomatous polyposis     subtotal colectomy, proctectomy (5/12),  duodenal and gastric adenomas, followed by [facility]  . Pancreatitis 2012  . GERD (gastroesophageal reflux disease)   . Arthritis   . Pulmonary embolism [facility]) May 2013    multiple  . Sessile rectal polyp   . Tubular adenoma     gastric, multiple  . Anemia     no recent iron use  . PONV (postoperative nausea and vomiting) 2012    nausea after last surgery   Past Surgical History  Procedure Laterality Date  . Rotator cuff repair Right 1990  . Carpal tunnel release Left   . Tubal ligation    . Elbow surgery Right   . Lumbar spine surgery      X 2  . Proctectomy  02/16/11    with loop ileostomy [facility] [place]  . Total colectomy  1976    with ileostomy/ileo-rectal anastomosis    . Ileostomy closure  08/17/11    Dr. Brooke Pace  . Flexible sigmoidoscopy    . Esophagogastroduodenoscopy    . Esophagogastroduodenoscopy N/A 10/16/2014    Procedure: ESOPHAGOGASTRODUODENOSCOPY (EGD);  Surgeon: Gatha Mayer, MD;  Location: [facility];  Service: Endoscopy;  Laterality: N/A;  need ercp scope  . Flexible sigmoidoscopy N/A 10/16/2014    Procedure: FLEXIBLE SIGMOIDOSCOPY;  Surgeon: Gatha Mayer, MD;  Location: [facility] ENDOSCOPY;  Service: Endoscopy;  Laterality: N/A;  . Appendectomy    . Hernia repair     Social History   Social History  . Marital Status: Married    Spouse Name: N/A  . Number of Children: 2  . Years of Education: N/A   Occupational History  . employed [employer]  . Scientist, clinical (histocompatibility and immunogenetics) support    Social History Main Topics  . Smoking status: Never Smoker   . Smokeless tobacco: Never Used  . Alcohol Use: No  . Drug Use: No  . Sexual Activity: Not Asked   Other Topics Concern  . None   Social History Narrative   The patient is widowed she has 2 daughters and grandchildren that live in [place]   She works as a Hotel manager support person   2-3 caffeinated beverages daily  Family History  Problem Relation Age of Onset  . Colon cancer Father   . Familial polyposis Father   . Esophageal cancer Father   . Heart disease Mother   . Brain cancer Sister    Review of Systems As above, sister has terminal brain cancer so stress with that    Objective:   Physical Exam @BP  130/80 mmHg  Pulse 100  Ht 5\' 2"  (1.575 m)  Wt 213 lb 4 oz (96.73 kg)  BMI 38.99 kg/m2@  General:  NAD Eyes:   anicteric Lungs:  clear Heart:: S1S2 no rubs, murmurs or gallops Abdomen:  soft and nontender, BS+ Ext:   no edema, cyanosis or clubbing    Data Reviewed:   As above    Assessment & Plan:   1. Familial polyposis   2. Gastric polyps    Plan for upper GI endoscopy including using the duodenoscope to look at the ampulla as well as  surveillance of the remaining rectal tissue and adenomatous changes there using a sigmoidoscope. This will be done at [facility] where I have the duodenoscope available. Anticipating March.The risks and benefits as well as alternatives of endoscopic procedure(s) have been discussed and reviewed. All questions answered. The patient agrees to proceed.

## 2015-12-30 ENCOUNTER — Encounter (HOSPITAL_COMMUNITY): Payer: Self-pay | Admitting: Internal Medicine

## 2015-12-30 ENCOUNTER — Encounter: Payer: Self-pay | Admitting: Internal Medicine

## 2015-12-30 NOTE — Progress Notes (Signed)
Quick Note:  EGD- fundic gland polyps and one adenoma fragment Anorectum - adenomas  Recall EGD/flex 1 year  Letter created ______

## 2016-06-27 ENCOUNTER — Other Ambulatory Visit: Payer: Self-pay | Admitting: Obstetrics and Gynecology

## 2016-06-27 DIAGNOSIS — Z1231 Encounter for screening mammogram for malignant neoplasm of breast: Secondary | ICD-10-CM

## 2016-07-04 DIAGNOSIS — Z23 Encounter for immunization: Secondary | ICD-10-CM | POA: Diagnosis not present

## 2016-07-04 DIAGNOSIS — D126 Benign neoplasm of colon, unspecified: Secondary | ICD-10-CM | POA: Diagnosis not present

## 2016-07-07 ENCOUNTER — Ambulatory Visit
Admission: RE | Admit: 2016-07-07 | Discharge: 2016-07-07 | Disposition: A | Discharge status: Home / Self Care | Payer: BLUE CROSS/BLUE SHIELD | Source: Ambulatory Visit | Attending: Obstetrics and Gynecology | Admitting: Obstetrics and Gynecology

## 2016-07-07 DIAGNOSIS — Z1231 Encounter for screening mammogram for malignant neoplasm of breast: Secondary | ICD-10-CM

## 2016-10-01 ENCOUNTER — Other Ambulatory Visit: Payer: Self-pay | Admitting: Internal Medicine

## 2016-11-11 ENCOUNTER — Telehealth: Payer: Self-pay | Admitting: Internal Medicine

## 2016-11-11 NOTE — Telephone Encounter (Signed)
Spoke with patient and she will get the form faxed to [place] to fill out for the celebrex.

## 2016-11-14 NOTE — Telephone Encounter (Signed)
Patient was unable to fax [place] the form so she brought it by here today.  Hopefully tomorrow I will get to work on this prior authorization.

## 2016-11-15 NOTE — Telephone Encounter (Signed)
  Called to do the prior authorization on the Celebrex and [facility] [employer] and they said I have to call back in February.  They said patient just picked up a supply.

## 2016-11-21 ENCOUNTER — Encounter: Payer: Self-pay | Admitting: Internal Medicine

## 2016-11-21 NOTE — Telephone Encounter (Signed)
Will discuss next steps

## 2016-11-21 NOTE — Telephone Encounter (Signed)
The Celecoxib has been denied.  There is a phone # that Dr Carlean Purl can call to appeal, I will put this paper in his office.

## 2016-11-21 NOTE — Telephone Encounter (Signed)
Spoke with Margarita Grizzle at [employer] and answered questions to start a prior authorization for the celecoxib 400mg  capsules used for the patients dx:  Familial adenomatous polyposis D12.6.  She said they will fax [place] a determination in 24-48 hours.

## 2016-11-21 NOTE — Telephone Encounter (Signed)
Called [employer] back and re-filed the prior authorization under her osteoarthritis dx M19.90 and it was approved for one year, patient aware.

## 2016-12-01 ENCOUNTER — Encounter: Payer: Self-pay | Admitting: Internal Medicine

## 2016-12-06 ENCOUNTER — Telehealth: Payer: Self-pay | Admitting: Internal Medicine

## 2016-12-06 NOTE — Telephone Encounter (Signed)
Patient advised that no dates available in April. She has been scheduled for 01/10/17 11:00 with 9:30 arrival and pre-visit for 12/29/16 3:30.

## 2016-12-12 DIAGNOSIS — Z01419 Encounter for gynecological examination (general) (routine) without abnormal findings: Secondary | ICD-10-CM | POA: Diagnosis not present

## 2016-12-12 DIAGNOSIS — Z6838 Body mass index (BMI) 38.0-38.9, adult: Secondary | ICD-10-CM | POA: Diagnosis not present

## 2016-12-29 ENCOUNTER — Ambulatory Visit (AMBULATORY_SURGERY_CENTER): Payer: Self-pay | Admitting: *Deleted

## 2016-12-29 VITALS — Ht 62.0 in | Wt 218.8 lb

## 2016-12-29 DIAGNOSIS — Z86010 Personal history of colonic polyps: Secondary | ICD-10-CM

## 2016-12-29 DIAGNOSIS — K219 Gastro-esophageal reflux disease without esophagitis: Secondary | ICD-10-CM

## 2016-12-29 NOTE — Progress Notes (Signed)
Pt denies allergies to eggs or soy products. Denies difficulty with sedation or anesthesia. Denies any diet or weight loss medications. Denies use of supplemental oxygen.  Emmi instructions refused by patient. 

## 2017-01-09 ENCOUNTER — Encounter (HOSPITAL_COMMUNITY): Payer: Self-pay | Admitting: *Deleted

## 2017-01-10 ENCOUNTER — Encounter (HOSPITAL_COMMUNITY): Payer: Self-pay | Admitting: Internal Medicine

## 2017-01-10 ENCOUNTER — Encounter (HOSPITAL_COMMUNITY)
Admission: RE | Disposition: A | Discharge status: Home / Self Care | Payer: Self-pay | Source: Ambulatory Visit | Attending: Internal Medicine

## 2017-01-10 ENCOUNTER — Ambulatory Visit (HOSPITAL_COMMUNITY): Payer: BLUE CROSS/BLUE SHIELD | Admitting: Anesthesiology

## 2017-01-10 ENCOUNTER — Ambulatory Visit (HOSPITAL_COMMUNITY)
Admission: RE | Admit: 2017-01-10 | Discharge: 2017-01-10 | Disposition: A | Discharge status: Home / Self Care | Payer: BLUE CROSS/BLUE SHIELD | Source: Ambulatory Visit | Attending: Internal Medicine | Admitting: Internal Medicine

## 2017-01-10 DIAGNOSIS — Z9049 Acquired absence of other specified parts of digestive tract: Secondary | ICD-10-CM | POA: Insufficient documentation

## 2017-01-10 DIAGNOSIS — Z791 Long term (current) use of non-steroidal anti-inflammatories (NSAID): Secondary | ICD-10-CM | POA: Insufficient documentation

## 2017-01-10 DIAGNOSIS — Z86711 Personal history of pulmonary embolism: Secondary | ICD-10-CM | POA: Diagnosis not present

## 2017-01-10 DIAGNOSIS — D128 Benign neoplasm of rectum: Secondary | ICD-10-CM

## 2017-01-10 DIAGNOSIS — Z86010 Personal history of colonic polyps: Secondary | ICD-10-CM | POA: Insufficient documentation

## 2017-01-10 DIAGNOSIS — Z1211 Encounter for screening for malignant neoplasm of colon: Secondary | ICD-10-CM | POA: Insufficient documentation

## 2017-01-10 DIAGNOSIS — Z8719 Personal history of other diseases of the digestive system: Secondary | ICD-10-CM | POA: Insufficient documentation

## 2017-01-10 DIAGNOSIS — D132 Benign neoplasm of duodenum: Secondary | ICD-10-CM

## 2017-01-10 DIAGNOSIS — K219 Gastro-esophageal reflux disease without esophagitis: Secondary | ICD-10-CM | POA: Diagnosis not present

## 2017-01-10 DIAGNOSIS — K317 Polyp of stomach and duodenum: Secondary | ICD-10-CM

## 2017-01-10 DIAGNOSIS — K2950 Unspecified chronic gastritis without bleeding: Secondary | ICD-10-CM | POA: Diagnosis not present

## 2017-01-10 DIAGNOSIS — Z8371 Family history of colonic polyps: Secondary | ICD-10-CM | POA: Diagnosis not present

## 2017-01-10 DIAGNOSIS — K3189 Other diseases of stomach and duodenum: Secondary | ICD-10-CM | POA: Insufficient documentation

## 2017-01-10 DIAGNOSIS — D126 Benign neoplasm of colon, unspecified: Secondary | ICD-10-CM

## 2017-01-10 DIAGNOSIS — Z79899 Other long term (current) drug therapy: Secondary | ICD-10-CM | POA: Diagnosis not present

## 2017-01-10 DIAGNOSIS — Z980 Intestinal bypass and anastomosis status: Secondary | ICD-10-CM | POA: Insufficient documentation

## 2017-01-10 DIAGNOSIS — K621 Rectal polyp: Secondary | ICD-10-CM | POA: Diagnosis not present

## 2017-01-10 HISTORY — PX: ESOPHAGOGASTRODUODENOSCOPY (EGD) WITH PROPOFOL: SHX5813

## 2017-01-10 HISTORY — PX: FLEXIBLE SIGMOIDOSCOPY: SHX5431

## 2017-01-10 SURGERY — ESOPHAGOGASTRODUODENOSCOPY (EGD) WITH PROPOFOL
Anesthesia: Monitor Anesthesia Care

## 2017-01-10 MED ORDER — LACTATED RINGERS IV SOLN
INTRAVENOUS | Status: DC | PRN
  Administered 2017-01-10: 10:00:00 via INTRAVENOUS

## 2017-01-10 MED ORDER — SODIUM CHLORIDE 0.9 % IV SOLN: INTRAVENOUS | Status: DC

## 2017-01-10 MED ORDER — LACTATED RINGERS IV SOLN: INTRAVENOUS | Status: DC

## 2017-01-10 MED ORDER — PROPOFOL 10 MG/ML IV BOLUS
INTRAVENOUS | Status: AC
  Filled 2017-01-10: qty 40

## 2017-01-10 MED ORDER — PROPOFOL 10 MG/ML IV BOLUS
INTRAVENOUS | Status: DC | PRN
  Administered 2017-01-10 (×6): 20 mg via INTRAVENOUS
  Administered 2017-01-10: 10 mg via INTRAVENOUS
  Administered 2017-01-10: 20 mg via INTRAVENOUS
  Administered 2017-01-10: 10 mg via INTRAVENOUS
  Administered 2017-01-10 (×2): 20 mg via INTRAVENOUS
  Administered 2017-01-10 (×2): 30 mg via INTRAVENOUS
  Administered 2017-01-10 (×2): 20 mg via INTRAVENOUS

## 2017-01-10 SURGICAL SUPPLY — 14 items

## 2017-01-10 NOTE — Anesthesia Preprocedure Evaluation (Addendum)
Anesthesia Evaluation  Patient identified by MRN, date of birth, ID band Patient awake    Reviewed: Allergy & Precautions, H&P , Patient's Chart, lab work & pertinent test results, reviewed documented beta blocker date and time   Airway Mallampati: II  TM Distance: >3 FB Neck ROM: full    Dental no notable dental hx.    Pulmonary    Pulmonary exam normal breath sounds clear to auscultation       Cardiovascular  Rhythm:regular Rate:Normal     Neuro/Psych    GI/Hepatic   Endo/Other    Renal/GU      Musculoskeletal   Abdominal   Peds  Hematology   Anesthesia Other Findings   Reproductive/Obstetrics                             Anesthesia Physical Anesthesia Plan  ASA: III  Anesthesia Plan: MAC   Post-op Pain Management:    Induction: Intravenous  Airway Management Planned: Mask and Natural Airway  Additional Equipment:   Intra-op Plan:   Post-operative Plan:   Informed Consent: I have reviewed the patients History and Physical, chart, labs and discussed the procedure including the risks, benefits and alternatives for the proposed anesthesia with the patient or authorized representative who has indicated his/her understanding and acceptance.   Dental Advisory Given  Plan Discussed with: CRNA and Surgeon  Anesthesia Plan Comments:        Anesthesia Quick Evaluation

## 2017-01-10 NOTE — Discharge Instructions (Signed)
° °  I think things look similar to last year. I took the usual biopsies and will let you know.  Take care and call me if needed.  I appreciate the opportunity to care for you. Gatha Mayer, MD, FACG  YOU HAD AN ENDOSCOPIC PROCEDURE TODAY: Refer to the procedure report and other information in the discharge instructions given to you for any specific questions about what was found during the examination. If this information does not answer your questions, please call Dr. Celesta Aver office at [phone] to clarify.   YOU SHOULD EXPECT: Some feelings of bloating in the abdomen. Passage of more gas than usual. Walking can help get rid of the air that was put into your GI tract during the procedure and reduce the bloating. If you had a lower endoscopy (such as a colonoscopy or flexible sigmoidoscopy) you may notice spotting of blood in your stool or on the toilet paper. Some abdominal soreness may be present for a day or two, also.  DIET: Your first meal following the procedure should be a light meal and then it is ok to progress to your normal diet. A half-sandwich or bowl of soup is an example of a good first meal. Heavy or fried foods are harder to digest and may make you feel nauseous or bloated. Drink plenty of fluids but you should avoid alcoholic beverages for 24 hours.   ACTIVITY: Your care partner should take you home directly after the procedure. You should plan to take it easy, moving slowly for the rest of the day. You can resume normal activity the day after the procedure however YOU SHOULD NOT DRIVE, use power tools, machinery or perform tasks that involve climbing or major physical exertion for 24 hours (because of the sedation medicines used during the test).   SYMPTOMS TO REPORT IMMEDIATELY: A gastroenterologist can be reached at any hour. Please call [phone]  for any of the following symptoms:  Following lower endoscopy (colonoscopy, flexible sigmoidoscopy) Excessive  amounts of blood in the stool  Significant tenderness, worsening of abdominal pains  Swelling of the abdomen that is new, acute  Fever of 100 or higher  Following upper endoscopy (EGD, EUS, ERCP, esophageal dilation) Vomiting of blood or coffee ground material  New, significant abdominal pain  New, significant chest pain or pain under the shoulder blades  Painful or persistently difficult swallowing  New shortness of breath  Black, tarry-looking or red, bloody stools  FOLLOW UP:  If any biopsies were taken you will be contacted by phone or by letter within the next 1-3 weeks. Call [phone]  if you have not heard about the biopsies in 3 weeks.  Please also call with any specific questions about appointments or follow up tests.

## 2017-01-10 NOTE — H&P (Signed)
[facility] History and Physical     Reason for Procedure:   F/u polyps in upper GI tract and rectal stripe  Plan:    EGD, proctoscopy  The risks and benefits as well as alternatives of endoscopic procedure(s) have been discussed and reviewed. All questions answered. The patient agrees to proceed.    HPI: Emma Lutz is a 61 y.o. female with FAP here for surveillance of polyps.   Past Medical History:  Diagnosis Date  . Anemia yrs ago   no recent iron use  . Clotting disorder ([place]) 2012   post op blood clots in lungs  . Familial adenomatous polyposis    subtotal colectomy, proctectomy (5/12),  duodenal and gastric adenomas, followed by [facility])  . GERD (gastroesophageal reflux disease)   . Osteoarthritis   . Pancreatitis 2012  . PONV (postoperative nausea and vomiting) 2012   nausea after last surgery  . 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 Sessile rectal polyp   . Tubular adenoma    gastric, multiple    Past Surgical History:  Procedure Laterality Date  . APPENDECTOMY    . CARPAL TUNNEL RELEASE Left   . ELBOW SURGERY Right   . ESOPHAGOGASTRODUODENOSCOPY    . ESOPHAGOGASTRODUODENOSCOPY N/A 10/16/2014   Procedure: ESOPHAGOGASTRODUODENOSCOPY (EGD);  Surgeon: Gatha Mayer, MD;  Location: [facility];  Service: Endoscopy;  Laterality: N/A;  need ercp scope  . ESOPHAGOGASTRODUODENOSCOPY N/A 12/24/2015   Procedure: ESOPHAGOGASTRODUODENOSCOPY (EGD);  Surgeon: Gatha Mayer, MD;  Location: [facility];  Service: Endoscopy;  Laterality: N/A;  May need ERCP scope  . FLEXIBLE SIGMOIDOSCOPY    . FLEXIBLE SIGMOIDOSCOPY N/A 10/16/2014   Procedure: FLEXIBLE SIGMOIDOSCOPY;  Surgeon: Gatha Mayer, MD;  Location: [facility];  Service: Endoscopy;  Laterality: N/A;  . FLEXIBLE SIGMOIDOSCOPY N/A 12/24/2015   Procedure: FLEXIBLE SIGMOIDOSCOPY;  Surgeon: Gatha Mayer, MD;  Location: [facility];  Service: Endoscopy;  Laterality: N/A;  . HERNIA  REPAIR    . ILEOSTOMY CLOSURE  08/17/11   Dr. Brooke Pace  . LUMBAR SPINE SURGERY     X 2  . PROCTECTOMY  02/16/11   with loop ileostomy [place] [place]  . ROTATOR CUFF REPAIR Right 1990  . TOTAL COLECTOMY  1976   with ileostomy/ileo-rectal anastomosis  . TUBAL LIGATION      Prior to Admission medications   Medication Sig Start Date End Date Taking? Authorizing Provider  celecoxib (CELEBREX) 400 MG capsule TAKE ONE CAPSULE BY MOUTH TWICE DAILY Patient taking differently: take 400mg s by mouth every morning 10/03/16  Yes Gatha Mayer, MD  lansoprazole (PREVACID) 30 MG capsule TAKE ONE CAPSULE BY MOUTH ONCE DAILY 10/03/16  Yes Gatha Mayer, MD  loratadine (CLARITIN) 10 MG tablet Take 10 mg by mouth daily as needed for allergies.   Yes Historical Provider, MD  PARoxetine (PAXIL-CR) 25 MG 24 hr tablet Take 25 mg by mouth daily.  09/06/14  Yes Historical Provider, MD  Probiotic CAPS Take 1 capsule by mouth daily.   Yes Historical Provider, MD    No current facility-administered medications for this encounter.     Allergies as of 12/06/2016 - Review Complete 07/07/2016  Allergen Reaction Noted  . Sulfonamide derivatives Rash 03/26/2010    Family History  Problem Relation Age of Onset  . Heart disease Mother   . Emphysema Mother   . Familial polyposis Father   . Esophageal cancer Father   . Colon cancer Father   .  Diabetes Father   . Colon polyps Father   . Kidney disease Father   . Transient ischemic attack Father   . Brain cancer Sister     Social History   Social History  . Marital status: Married    Spouse name: N/A  . Number of children: 2  . Years of education: N/A   Occupational History  . employed [employer]  . Scientist, clinical (histocompatibility and immunogenetics) support    Social History Main Topics  . Smoking status: Never Smoker  . Smokeless tobacco: Never Used  . Alcohol use No  . Drug use: No  . Sexual activity: Not on file   Other Topics Concern  . Not on file    Social History Narrative   The patient is widowed she has 2 daughters and grandchildren that live in [place]   She works as a Hotel manager support person   2-3 caffeinated beverages daily    Review of Systems:  All other review of systems negative except as mentioned in the HPI.  Physical Exam: Vital signs in last 24 hours:     General:   Alert,  Well-developed, well-nourished, pleasant and cooperative in NAD Lungs:  Clear throughout to auscultation.   Heart:  Regular rate and rhythm; no murmurs, clicks, rubs,  or gallops. Abdomen:  Soft, nontender and nondistended. Normal bowel sounds.   Neuro/Psych:  Alert and cooperative. Normal mood and affect. A and O x 3   @Emma Lutz  Emma Maffucci, MD, [facility] [phone] (pager) 01/10/2017 9:59 AM@

## 2017-01-10 NOTE — Transfer of Care (Signed)
Immediate Anesthesia Transfer of Care Note  Patient: Emma Lutz  Procedure(s) Performed: Procedure(s): ESOPHAGOGASTRODUODENOSCOPY (EGD) WITH PROPOFOL (N/A) FLEXIBLE SIGMOIDOSCOPY (N/A)  Patient Location: PACU  Anesthesia Type:MAC  Level of Consciousness: awake, alert  and oriented  Airway & Oxygen Therapy: Patient Spontanous Breathing and Patient connected to nasal cannula oxygen  Post-op Assessment: Report given to RN and Post -op Vital signs reviewed and stable  Post vital signs: Reviewed and stable  Last Vitals:  Vitals:   01/10/17 1004  BP: (!) 162/93  Pulse: 96  Resp: 13  Temp: 36.7 C    Last Pain:  Vitals:   01/10/17 1004  TempSrc: Oral         Complications: No apparent anesthesia complications

## 2017-01-10 NOTE — Op Note (Signed)
[facility] Patient Name: Emma Lutz Procedure Date: 01/10/2017 MRN: [id number] Attending MD: Gatha Mayer , MD Date of Birth: [birth year] CSN: [id number] Age: 61 Admit Type: Outpatient Procedure:                Upper GI endoscopy Indications:              Polyps in the duodenum, Familial Adenomatous                            Polyposis, Gastric polyps Providers:                Gatha Mayer, MD, Elmer Ramp. Tilden Dome, RN, Corliss Parish, Technician Referring MD:              Medicines:                Propofol per Anesthesia, Monitored Anesthesia Care Complications:            No immediate complications. Estimated Blood Loss:     Estimated blood loss was minimal. Procedure:                Pre-Anesthesia Assessment:                           - Prior to the procedure, a History and Physical                            was performed, and patient medications and                            allergies were reviewed. The patient's tolerance of                            previous anesthesia was also reviewed. The risks                            and benefits of the procedure and the sedation                            options and risks were discussed with the patient.                            All questions were answered, and informed consent                            was obtained. Prior Anticoagulants: The patient                            last took previous NSAID medication 1 day prior to                            the procedure. ASA Grade Assessment: III - A  patient with severe systemic disease. After                            reviewing the risks and benefits, the patient was                            deemed in satisfactory condition to undergo the                            procedure.                           After obtaining informed consent, the endoscope was                            passed under direct  vision. Throughout the                            procedure, the patient's blood pressure, pulse, and                            oxygen saturations were monitored continuously. The                            Endoscope was introduced through the mouth, and                            advanced to the second part of duodenum. The                            [id number]) scope was introduced through                            the and advanced to the. The upper GI endoscopy was                            accomplished without difficulty. The patient                            tolerated the procedure well. Scope In: Scope Out: Findings:      Multiple 1 to 20 mm semi-sessile polyps with no bleeding and no stigmata       of recent bleeding were found in the entire examined stomach. Biopsies       were taken with a cold forceps for histology. Estimated blood loss was       minimal. Verification of patient identification for the specimen was       done. One pre-pyloric polyp with eroded surface biopsied separately.       Other gastric polyps in same bottle.      A few small sessile polyps with no bleeding were found in the second       portion of the duodenum.      The exam was otherwise without abnormality. Impression:               - Multiple gastric polyps. Biopsied. Nothing  suspicious seen.                           - A few duodenal polyps. Ampulla looks fine s/p                            ampullectomy - duodenoscope used to examin also.                           - The examination was otherwise normal. Moderate Sedation:      N/A- Per Anesthesia Care Recommendation:           - Patient has a contact number available for                            emergencies. The signs and symptoms of potential                            delayed complications were discussed with the                            patient. Return to normal activities tomorrow.                             Written discharge instructions were provided to the                            patient.                           - Continue present medications.                           - Await pathology results.                           - Resume previous diet.                           - See the other procedure note for documentation of                            additional recommendations. Procedure Code(s):        --- Professional ---                           [phone], Esophagogastroduodenoscopy, flexible,                            transoral; with biopsy, single or multiple Diagnosis Code(s):        --- Professional ---                           K31.7, Polyp of stomach and duodenum                           D12.6, Benign neoplasm of colon, unspecified  CPT copyright 2016 American Medical Association. All rights reserved. The codes documented in this report are preliminary and upon coder review may  be revised to meet current compliance requirements. Gatha Mayer, MD 01/10/2017 11:05:55 AM This report has been signed electronically. Number of Addenda: 0

## 2017-01-10 NOTE — Op Note (Signed)
[facility] Patient Name: Emma Lutz Procedure Date: 01/10/2017 MRN: [id number] Attending MD: Gatha Mayer , MD Date of Birth: 02-06-1956 CSN: [id number] Age: 61 Admit Type: Outpatient Procedure:                Flexible Sigmoidoscopy (procto-ileoscopy) Indications:              High risk colon cancer surveillance: Personal                            history of colonic polyps Providers:                Gatha Mayer, MD, Tory Emerald, RN, Corliss Parish, Technician Referring MD:              Medicines:                Propofol per Anesthesia, Monitored Anesthesia Care Complications:            No immediate complications. Estimated Blood Loss:     Estimated blood loss was minimal. Procedure:                Pre-Anesthesia Assessment:                           - Prior to the procedure, a History and Physical                            was performed, and patient medications and                            allergies were reviewed. The patient's tolerance of                            previous anesthesia was also reviewed. The risks                            and benefits of the procedure and the sedation                            options and risks were discussed with the patient.                            All questions were answered, and informed consent                            was obtained. Prior Anticoagulants: The patient                            last took previous NSAID medication 1 day prior to                            the procedure. ASA Grade Assessment: III - A  patient with severe systemic disease. After                            reviewing the risks and benefits, the patient was                            deemed in satisfactory condition to undergo the                            procedure.                           - Prior to the procedure, a History and Physical                            was  performed, and patient medications and                            allergies were reviewed. The patient's tolerance of                            previous anesthesia was also reviewed. The risks                            and benefits of the procedure and the sedation                            options and risks were discussed with the patient.                            All questions were answered, and informed consent                            was obtained. Prior Anticoagulants: The patient                            last took previous NSAID medication 1 day prior to                            the procedure. ASA Grade Assessment: III - A                            patient with severe systemic disease. After                            reviewing the risks and benefits, the patient was                            deemed in satisfactory condition to undergo the                            procedure.  After obtaining informed consent, the scope was                            passed under direct vision. The Endoscope was                            introduced through the anus and advanced to the the                            ileo-rectal anastomosis. The flexible sigmoidoscopy                            was accomplished without difficulty. The patient                            tolerated the procedure well. The quality of the                            bowel preparation was good. Scope In: Scope Out: Findings:      The perianal and digital rectal examinations were normal.      Multiple sessile, non-bleeding polyps were found in the rectum. Just a       few. The polyps were diminutive in size. These were biopsied with a cold       forceps for histology. Verification of patient identification for the       specimen was done. Estimated blood loss was minimal.      The exam was otherwise without abnormality. Impression:               - Multiple - few diminutive,  non-bleeding polyps in                            the rectum that remains. Biopsied.                           - The examination was otherwise normal s/p                            ileo-anal anastomosis with small rectal tissue                            remaining Moderate Sedation:      N/A- Per Anesthesia Care Recommendation:           - Patient has a contact number available for                            emergencies. The signs and symptoms of potential                            delayed complications were discussed with the                            patient. Return to normal activities tomorrow.  Written discharge instructions were provided to the                            patient.                           - Resume previous diet.                           - Continue present medications. Procedure Code(s):        --- Professional ---                           [id number], Sigmoidoscopy, flexible; with biopsy, single                            or multiple Diagnosis Code(s):        --- Professional ---                           Z86.010, Personal history of colonic polyps                           K62.1, Rectal polyp CPT copyright 2016 American Medical Association. All rights reserved. The codes documented in this report are preliminary and upon coder review may  be revised to meet current compliance requirements. Gatha Mayer, MD 01/10/2017 11:11:31 AM This report has been signed electronically. Number of Addenda: 0

## 2017-01-11 ENCOUNTER — Encounter (HOSPITAL_COMMUNITY): Payer: Self-pay | Admitting: Internal Medicine

## 2017-01-11 NOTE — Anesthesia Postprocedure Evaluation (Signed)
Anesthesia Post Note  Patient: Emma Lutz  Procedure(s) Performed: Procedure(s) (LRB): ESOPHAGOGASTRODUODENOSCOPY (EGD) WITH PROPOFOL (N/A) FLEXIBLE SIGMOIDOSCOPY (N/A)  Patient location during evaluation: PACU Anesthesia Type: MAC Level of consciousness: awake and alert Pain management: pain level controlled Vital Signs Assessment: post-procedure vital signs reviewed and stable Respiratory status: spontaneous breathing, nonlabored ventilation, respiratory function stable and patient connected to nasal cannula oxygen Cardiovascular status: stable and blood pressure returned to baseline Anesthetic complications: no       Last Vitals:  Vitals:   01/10/17 1110 01/10/17 1120  BP: 106/66 124/73  Pulse: 74 78  Resp: 18 19  Temp:      Last Pain:  Vitals:   01/10/17 1053  TempSrc: Oral                 [name] EDWARD

## 2017-01-18 NOTE — Progress Notes (Signed)
One polyp adenomatous - antral polyp Rectal polyps are adenomas Other stomach polyps hyperplastic Should be removed Will discuss w/ her via My Chart  Recall EGD/flex 1 year

## 2017-01-21 ENCOUNTER — Other Ambulatory Visit: Payer: Self-pay | Admitting: Internal Medicine

## 2017-01-26 ENCOUNTER — Encounter: Payer: Self-pay | Admitting: Internal Medicine

## 2017-01-26 DIAGNOSIS — D131 Benign neoplasm of stomach: Secondary | ICD-10-CM | POA: Insufficient documentation

## 2017-01-26 HISTORY — DX: Benign neoplasm of stomach: D13.1

## 2017-01-26 NOTE — Progress Notes (Signed)
I spoke to her - explained I recommend removal of gastric polyp (adenoma) - she is ok with this  Plan for EGD in [place] with gastric polypectomy - she can do after May 1  Dx gastric adenoma

## 2017-02-03 ENCOUNTER — Ambulatory Visit (AMBULATORY_SURGERY_CENTER): Payer: BLUE CROSS/BLUE SHIELD

## 2017-02-03 VITALS — Ht 62.0 in | Wt 217.8 lb

## 2017-02-03 DIAGNOSIS — D126 Benign neoplasm of colon, unspecified: Secondary | ICD-10-CM

## 2017-02-03 NOTE — Progress Notes (Signed)
Denies allergies to eggs or soy products. Denies complication of anesthesia or sedation. Denies use of weight loss medication. Denies use of O2.   Emmi instructions given for colonoscopy.  

## 2017-02-28 ENCOUNTER — Encounter: Payer: Self-pay | Admitting: Internal Medicine

## 2017-02-28 ENCOUNTER — Ambulatory Visit (AMBULATORY_SURGERY_CENTER): Payer: BLUE CROSS/BLUE SHIELD | Admitting: Internal Medicine

## 2017-02-28 VITALS — BP 125/58 | HR 96 | Temp 98.4°F | Resp 25 | Ht 62.0 in | Wt 217.0 lb

## 2017-02-28 DIAGNOSIS — K317 Polyp of stomach and duodenum: Secondary | ICD-10-CM

## 2017-02-28 DIAGNOSIS — Z13818 Encounter for screening for other digestive system disorders: Secondary | ICD-10-CM | POA: Diagnosis present

## 2017-02-28 DIAGNOSIS — D126 Benign neoplasm of colon, unspecified: Secondary | ICD-10-CM

## 2017-02-28 DIAGNOSIS — D131 Benign neoplasm of stomach: Secondary | ICD-10-CM | POA: Diagnosis not present

## 2017-02-28 MED ORDER — SODIUM CHLORIDE 0.9 % IV SOLN: 500.0000 mL | INTRAVENOUS | Status: DC

## 2017-02-28 NOTE — Patient Instructions (Addendum)
    I removed the polyp. Will let you know results.  I appreciate the opportunity to care for you. Gatha Mayer, MD, FACG   YOU HAD AN ENDOSCOPIC PROCEDURE TODAY AT [facility]:   Refer to the procedure report that was given to you for any specific questions about what was found during the examination.  If the procedure report does not answer your questions, please call your gastroenterologist to clarify.  If you requested that your care partner not be given the details of your procedure findings, then the procedure report has been included in a sealed envelope for you to review at your convenience later.  YOU SHOULD EXPECT: Some feelings of bloating in the abdomen. Passage of more gas than usual.  Walking can help get rid of the air that was put into your GI tract during the procedure and reduce the bloating. If you had a lower endoscopy (such as a colonoscopy or flexible sigmoidoscopy) you may notice spotting of blood in your stool or on the toilet paper. If you underwent a bowel prep for your procedure, you may not have a normal bowel movement for a few days.  Please Note:  You might notice some irritation and congestion in your nose or some drainage.  This is from the oxygen used during your procedure.  There is no need for concern and it should clear up in a day or so.  SYMPTOMS TO REPORT IMMEDIATELY:    Following upper endoscopy (EGD)  Vomiting of blood or coffee ground material  New chest pain or pain under the shoulder blades  Painful or persistently difficult swallowing  New shortness of breath  Fever of 100F or higher  Black, tarry-looking stools  For urgent or emergent issues, a gastroenterologist can be reached at any hour by calling [phone].   DIET:  We do recommend a small meal at first, but then you may proceed to your regular diet.  Drink plenty of fluids but you should avoid alcoholic beverages for 24 hours.  ACTIVITY:  You should plan  to take it easy for the rest of today and you should NOT DRIVE or use heavy machinery until tomorrow (because of the sedation medicines used during the test).    FOLLOW UP: Our staff will call the number listed on your records the next business day following your procedure to check on you and address any questions or concerns that you may have regarding the information given to you following your procedure. If we do not reach you, we will leave a message.  However, if you are feeling well and you are not experiencing any problems, there is no need to return our call.  We will assume that you have returned to your regular daily activities without incident.  If any biopsies were taken you will be contacted by phone or by letter within the next 1-3 weeks.  Please call us at [phone] if you have not heard about the biopsies in 3 weeks.    SIGNATURES/CONFIDENTIALITY: You and/or your care partner have signed paperwork which will be entered into your electronic medical record.  These signatures attest to the fact that that the information above on your After Visit Summary has been reviewed and is understood.  Full responsibility of the confidentiality of this discharge information lies with you and/or your care-partner.  Thank you for letting us take care of your healthcare needs today.

## 2017-02-28 NOTE — Progress Notes (Signed)
To PACU, VSS. Report to RN. 

## 2017-02-28 NOTE — Op Note (Signed)
[place] Patient Name: Emma Lutz Procedure Date: 02/28/2017 10:15 AM MRN: [id number] Endoscopist: Gatha Mayer , MD Age: 61 Referring MD:  Date of Birth: Oct 01, 1956 Gender: Female Account #: [id number] Procedure:                Upper GI endoscopy Indications:              For therapy of gastric polyps Medicines:                Propofol per Anesthesia, Monitored Anesthesia Care Procedure:                Pre-Anesthesia Assessment:                           - Prior to the procedure, a History and Physical                            was performed, and patient medications and                            allergies were reviewed. The patient's tolerance of                            previous anesthesia was also reviewed. The risks                            and benefits of the procedure and the sedation                            options and risks were discussed with the patient.                            All questions were answered, and informed consent                            was obtained. Prior Anticoagulants: The patient has                            taken no previous anticoagulant or antiplatelet                            agents. ASA Grade Assessment: III - A patient with                            severe systemic disease. After reviewing the risks                            and benefits, the patient was deemed in                            satisfactory condition to undergo the procedure.                           After obtaining informed consent, the endoscope was  passed under direct vision. Throughout the                            procedure, the patient's blood pressure, pulse, and                            oxygen saturations were monitored continuously. The                            Endoscope was introduced through the mouth, and                            advanced to the prepyloric region, stomach. The    upper GI endoscopy was accomplished without                            difficulty. The patient tolerated the procedure                            well. Scope In: Scope Out: Findings:                 A single 8 mm sessile polyp with no bleeding and no                            stigmata of recent bleeding was found in the                            prepyloric region of the stomach. The polyp was                            removed with a hot snare. Resection and retrieval                            were complete. Verification of patient                            identification for the specimen was done. Estimated                            blood loss: none.                           Multiple 1 to 15 mm semi-sessile polyps with                            bleeding and stigmata of recent bleeding were found                            in the cardia, in the gastric fundus and in the                            gastric body. Slight ooze - friable polyps Complications:            No immediate complications. Estimated  Blood Loss:     Estimated blood loss: none. Impression:               - A single gastric polyp. Resected and retrieved.                           - Multiple gastric polyps. Recommendation:           - Patient has a contact number available for                            emergencies. The signs and symptoms of potential                            delayed complications were discussed with the                            patient. Return to normal activities tomorrow.                            Written discharge instructions were provided to the                            patient.                           - Resume previous diet.                           - Continue present medications.                           - Repeat upper endoscopy for surveillance based on                            pathology results. Gatha Mayer, MD 02/28/2017 10:53:25 AM This report has been signed  electronically.

## 2017-02-28 NOTE — Progress Notes (Signed)
Called to room to assist during endoscopic procedure.  Patient ID and intended procedure confirmed with present staff. Received instructions for my participation in the procedure from the performing physician.  

## 2017-03-01 ENCOUNTER — Telehealth: Payer: Self-pay

## 2017-03-01 NOTE — Telephone Encounter (Signed)
  Follow up Call-  Call back number 02/28/2017  Post procedure Call Back phone  # [phone]  Permission to leave phone message Yes  Some recent data might be hidden     Patient questions:  Do you have a fever, pain , or abdominal swelling? No. Pain Score  0 *  Have you tolerated food without any problems? Yes.    Have you been able to return to your normal activities? Yes.    Do you have any questions about your discharge instructions: Diet   No. Medications  No. Follow up visit  No.  Do you have questions or concerns about your Care? No.  Actions: * If pain score is 4 or above: No action needed, pain <4.

## 2017-03-07 ENCOUNTER — Encounter: Payer: Self-pay | Admitting: Internal Medicine

## 2017-03-07 NOTE — Progress Notes (Signed)
Benign adenoma of stomach Repeat egd and flex sig at hospital 1 year My Chart letter

## 2017-03-26 IMAGING — MG DIGITAL SCREENING BILATERAL MAMMOGRAM WITH CAD
4 series · 4 of 4 positions shown · non-contrast
Comparison: Previous exam(s).

CLINICAL DATA: Screening.

EXAM:
DIGITAL SCREENING BILATERAL MAMMOGRAM WITH CAD

[R MLO]
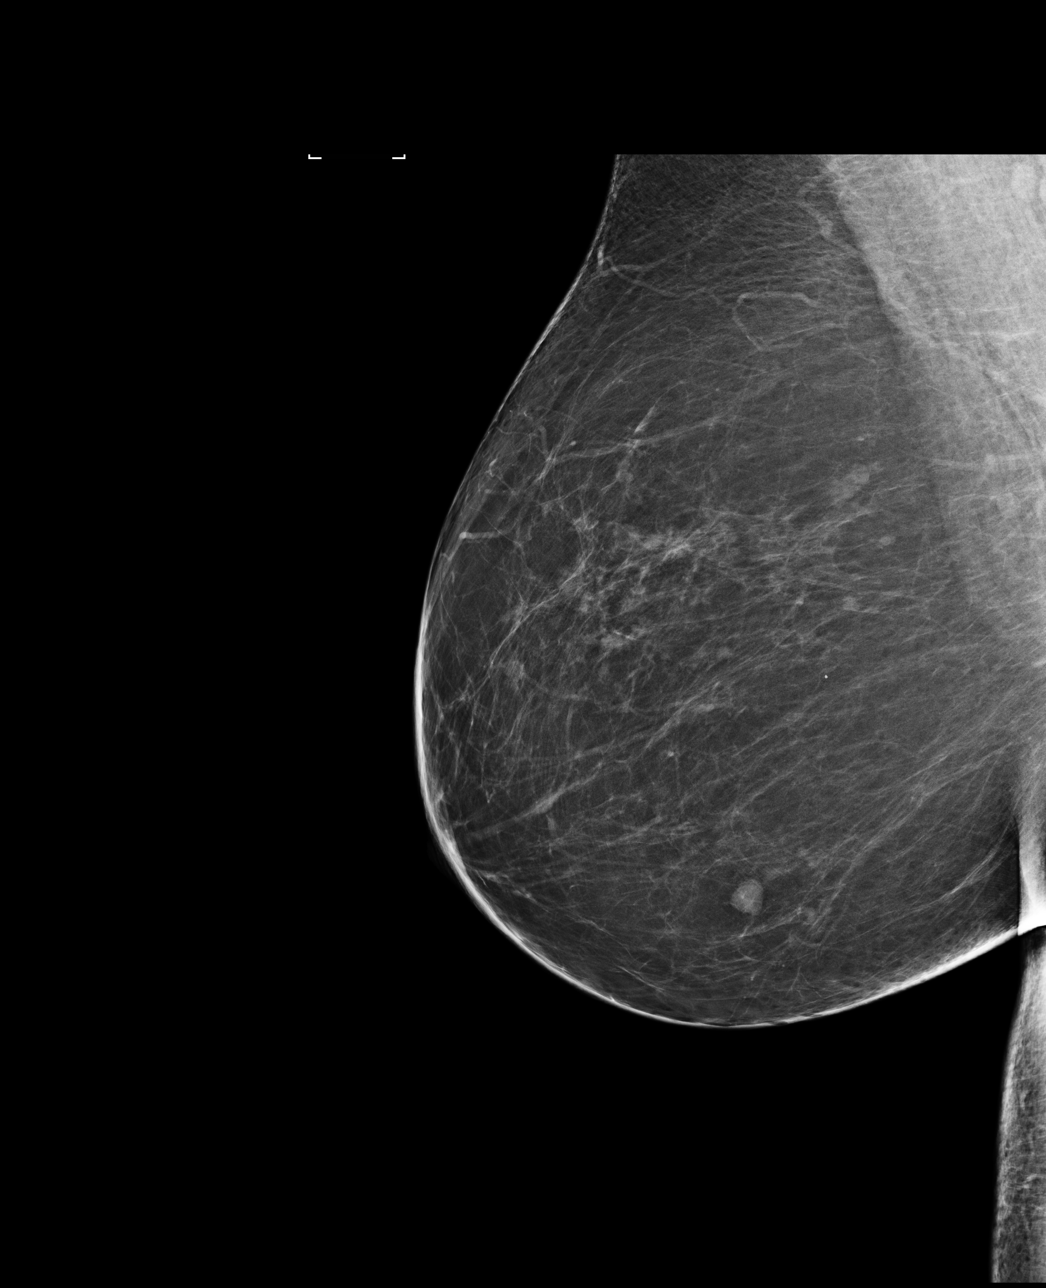

[R CC]
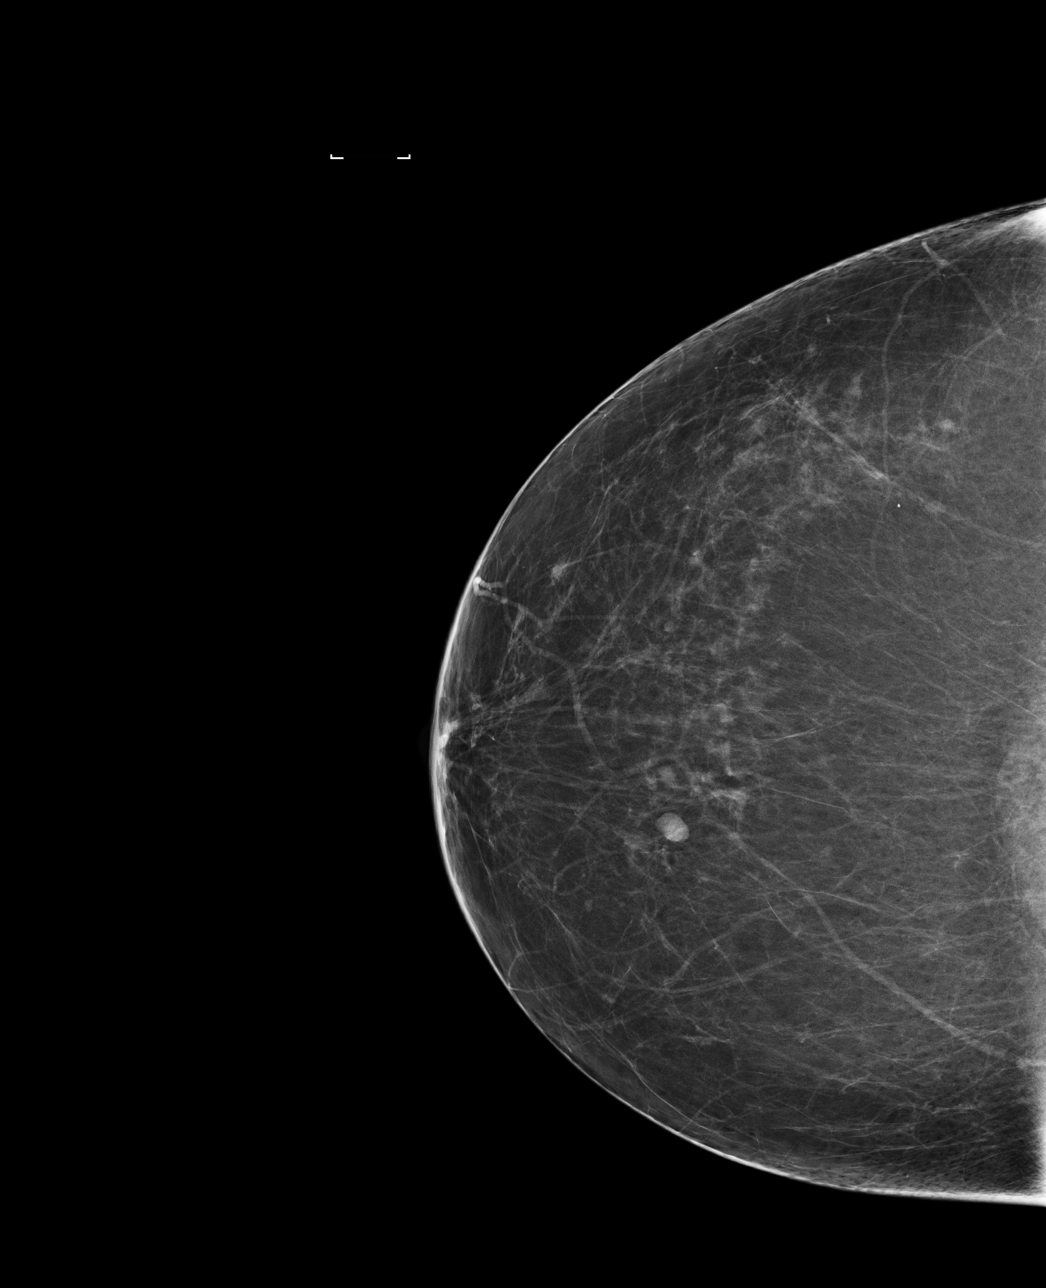

[L CC]
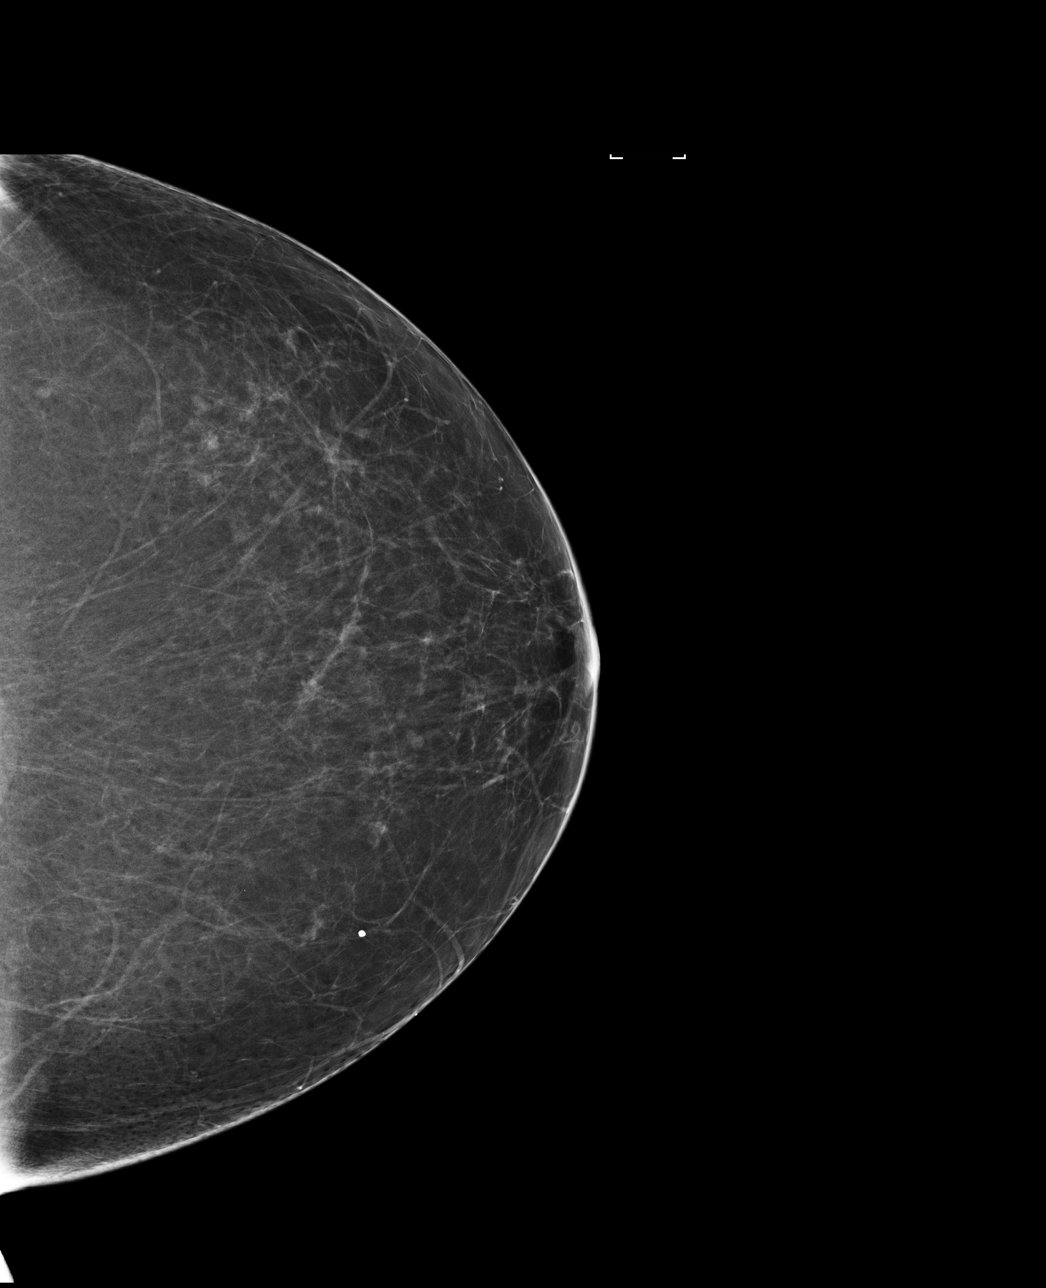

[L MLO]
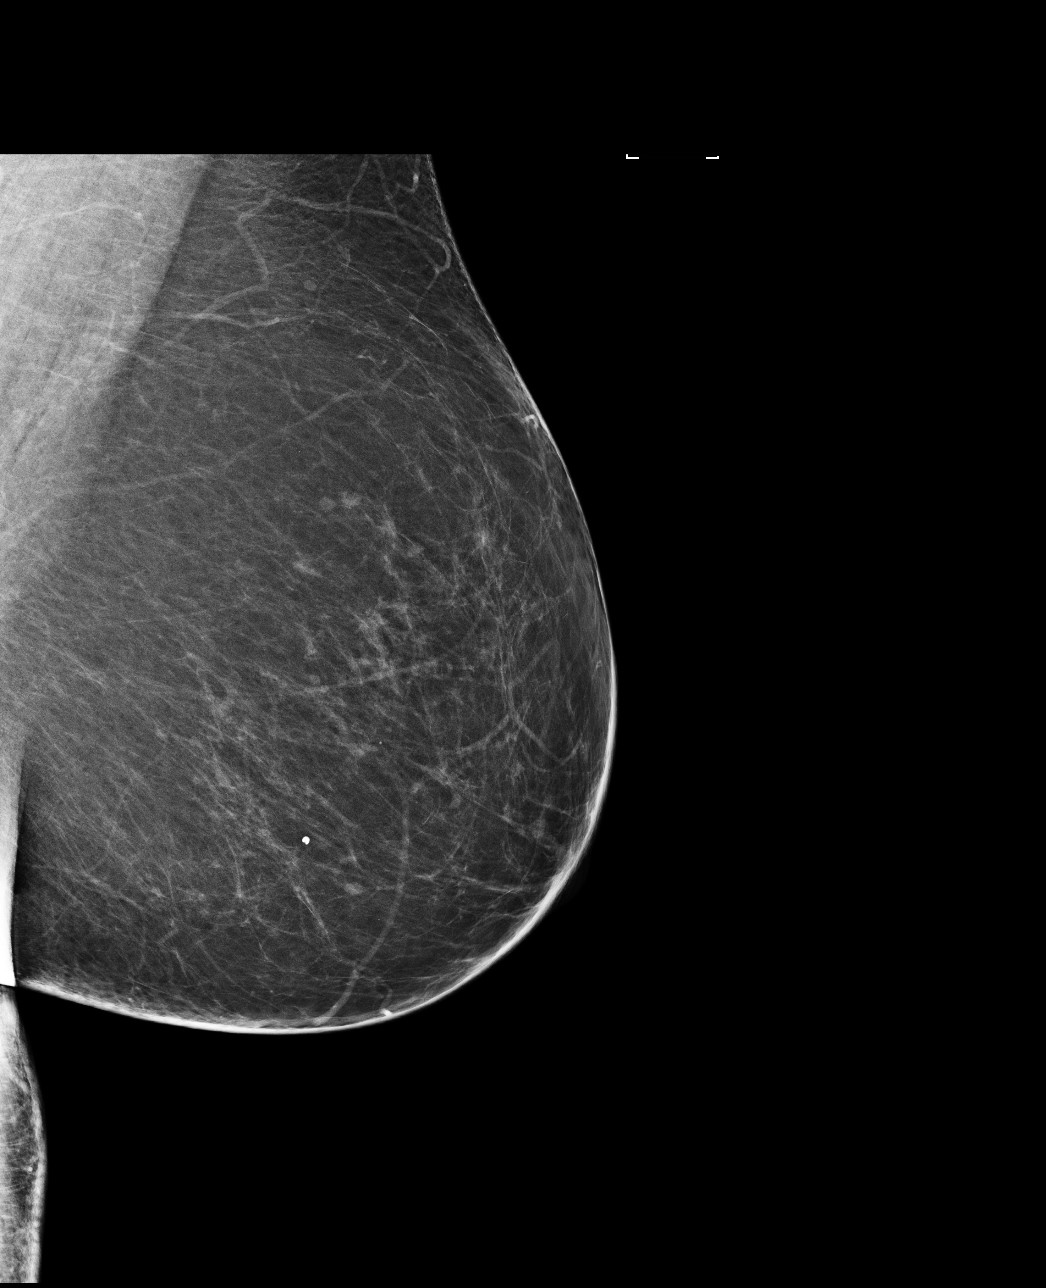

[4 of 4 positions shown; findings below may reference images not displayed]

ACR Breast Density Category b: There are scattered areas of
fibroglandular density.
FINDINGS: There are no findings suspicious for malignancy. Images were
processed with CAD.
IMPRESSION: No mammographic evidence of malignancy. A result letter of this
screening mammogram will be mailed directly to the patient.

RECOMMENDATION:
Screening mammogram in one year. (Code:AS-G-LCT)

BI-RADS CATEGORY  1: Negative.

## 2017-04-28 NOTE — Addendum Note (Signed)
Addendum  created 04/28/17 1347 by Lyndle Herrlich, MD   Sign clinical note

## 2017-04-28 NOTE — Anesthesia Postprocedure Evaluation (Signed)
Anesthesia Post Note  Patient: Emma Lutz  Procedure(s) Performed: Procedure(s) (LRB): ESOPHAGOGASTRODUODENOSCOPY (EGD) WITH PROPOFOL (N/A) FLEXIBLE SIGMOIDOSCOPY (N/A)     Anesthesia Post Evaluation  Last Vitals:  Vitals:   01/10/17 1110 01/10/17 1120  BP: 106/66 124/73  Pulse: 74 78  Resp: 18 19  Temp:      Last Pain:  Vitals:   01/10/17 1053  TempSrc: Oral                 Leontina Skidmore EDWARD

## 2017-05-03 ENCOUNTER — Other Ambulatory Visit: Payer: Self-pay | Admitting: Internal Medicine

## 2017-10-06 DIAGNOSIS — M79641 Pain in right hand: Secondary | ICD-10-CM | POA: Diagnosis not present

## 2017-10-15 ENCOUNTER — Other Ambulatory Visit: Payer: Self-pay | Admitting: Internal Medicine

## 2017-10-24 DIAGNOSIS — Z9889 Other specified postprocedural states: Secondary | ICD-10-CM | POA: Diagnosis not present

## 2017-10-24 DIAGNOSIS — G5601 Carpal tunnel syndrome, right upper limb: Secondary | ICD-10-CM | POA: Diagnosis not present

## 2017-10-25 DIAGNOSIS — G5601 Carpal tunnel syndrome, right upper limb: Secondary | ICD-10-CM | POA: Diagnosis not present

## 2017-11-23 ENCOUNTER — Telehealth: Payer: Self-pay

## 2017-11-23 NOTE — Telephone Encounter (Signed)
Did prior authorization over the phone with [facility] [employer] for patients celecoxib 400mg  capsules, dx M19.90, osteoarthrithis.  It is approved for a year, pharmacy informed.

## 2017-12-02 ENCOUNTER — Other Ambulatory Visit: Payer: Self-pay | Admitting: Internal Medicine

## 2017-12-18 DIAGNOSIS — Z1231 Encounter for screening mammogram for malignant neoplasm of breast: Secondary | ICD-10-CM | POA: Diagnosis not present

## 2017-12-18 DIAGNOSIS — Z6841 Body Mass Index (BMI) 40.0 and over, adult: Secondary | ICD-10-CM | POA: Diagnosis not present

## 2017-12-18 DIAGNOSIS — Z13820 Encounter for screening for osteoporosis: Secondary | ICD-10-CM | POA: Diagnosis not present

## 2017-12-18 DIAGNOSIS — Z01419 Encounter for gynecological examination (general) (routine) without abnormal findings: Secondary | ICD-10-CM | POA: Diagnosis not present

## 2018-01-03 DIAGNOSIS — Z13220 Encounter for screening for lipoid disorders: Secondary | ICD-10-CM | POA: Diagnosis not present

## 2018-01-03 DIAGNOSIS — Z13228 Encounter for screening for other metabolic disorders: Secondary | ICD-10-CM | POA: Diagnosis not present

## 2018-01-03 DIAGNOSIS — Z1329 Encounter for screening for other suspected endocrine disorder: Secondary | ICD-10-CM | POA: Diagnosis not present

## 2018-01-03 DIAGNOSIS — Z118 Encounter for screening for other infectious and parasitic diseases: Secondary | ICD-10-CM | POA: Diagnosis not present

## 2018-01-15 ENCOUNTER — Encounter: Payer: Self-pay | Admitting: Internal Medicine

## 2018-02-08 ENCOUNTER — Encounter: Payer: Self-pay | Admitting: Internal Medicine

## 2018-02-13 DIAGNOSIS — Z131 Encounter for screening for diabetes mellitus: Secondary | ICD-10-CM | POA: Diagnosis not present

## 2018-02-13 DIAGNOSIS — E559 Vitamin D deficiency, unspecified: Secondary | ICD-10-CM | POA: Diagnosis not present

## 2018-03-20 ENCOUNTER — Encounter: Payer: Self-pay | Admitting: Internal Medicine

## 2018-03-20 ENCOUNTER — Ambulatory Visit (INDEPENDENT_AMBULATORY_CARE_PROVIDER_SITE_OTHER): Payer: Self-pay | Admitting: Internal Medicine

## 2018-03-20 VITALS — BP 132/78 | HR 96 | Ht 62.0 in | Wt 208.0 lb

## 2018-03-20 DIAGNOSIS — D126 Benign neoplasm of colon, unspecified: Secondary | ICD-10-CM

## 2018-03-20 NOTE — Progress Notes (Signed)
     Emma Lutz presented today, typically she would just have a pre-visit with a nurse.  Setting up her procedures and I am going to see if I can arrange for her to have her procedures at the endoscopy center if we can get a hold of the duodena scope on a temporary basis.  He only briefly discussed I will let her know.

## 2018-03-20 NOTE — Patient Instructions (Signed)
  Dr Carlean Purl is going to check on getting a special scope and we will be in touch about setting up procedures.    I appreciate the opportunity to care for you. Ronney Lion, [facility]

## 2018-05-02 ENCOUNTER — Telehealth: Payer: Self-pay

## 2018-05-02 NOTE — Telephone Encounter (Signed)
-----   Message from Gatha Mayer, MD sent at 05/01/2018  6:33 PM EDT ----- Regarding: Call her Let her know I am still trying to see if there is a way I can do her procedures in the [place] - I hope to have a plan (if possible) by next month

## 2018-05-02 NOTE — Telephone Encounter (Signed)
Patient informed and she said she appreciated our call.

## 2018-05-03 ENCOUNTER — Encounter: Payer: Self-pay | Admitting: Internal Medicine

## 2018-05-29 NOTE — Telephone Encounter (Signed)
Please let her know that I am sorry but cannot get her procedures set up in [place] - cannot find a good way to get the ERCP scope here   So will have to do EGD - and flex at hospital  She may not need a prep - vs 1 enema - see what we did last time and let me know if she has any ?  Dx is familial adenomatous polyposis

## 2018-05-30 ENCOUNTER — Other Ambulatory Visit: Payer: Self-pay | Admitting: Internal Medicine

## 2018-05-30 ENCOUNTER — Encounter: Payer: Self-pay | Admitting: Internal Medicine

## 2018-05-30 DIAGNOSIS — D126 Benign neoplasm of colon, unspecified: Secondary | ICD-10-CM

## 2018-05-30 NOTE — Telephone Encounter (Signed)
Spoke with Emma Lutz and set her up pre-visit appointment for 06/14/18 at 4:30pm and EGD/flex sig at [facility] on 06/25/18 at 8:30AM. She did the magnesium citrate and enema prep last time and said she did fine with that. She didn't have any questions. We will mail her out a pre-visit form for her upcoming appointment.

## 2018-05-30 NOTE — Telephone Encounter (Signed)
Left Emma Lutz a message to call me back.

## 2018-06-04 DIAGNOSIS — R7303 Prediabetes: Secondary | ICD-10-CM | POA: Diagnosis not present

## 2018-06-04 DIAGNOSIS — L918 Other hypertrophic disorders of the skin: Secondary | ICD-10-CM | POA: Diagnosis not present

## 2018-06-14 DIAGNOSIS — L918 Other hypertrophic disorders of the skin: Secondary | ICD-10-CM | POA: Diagnosis not present

## 2018-06-20 ENCOUNTER — Ambulatory Visit (AMBULATORY_SURGERY_CENTER): Payer: Self-pay | Admitting: *Deleted

## 2018-06-20 VITALS — Ht 62.0 in | Wt 214.0 lb

## 2018-06-20 DIAGNOSIS — K317 Polyp of stomach and duodenum: Secondary | ICD-10-CM

## 2018-06-20 DIAGNOSIS — D126 Benign neoplasm of colon, unspecified: Secondary | ICD-10-CM

## 2018-06-20 NOTE — Progress Notes (Signed)
Patient denies any allergies to eggs or soy. Patient denies any problems with anesthesia/sedation. Patient denies any oxygen use at home. Patient denies taking any diet/weight loss medications or blood thinners. EMMI education offered, pt declined.  

## 2018-06-25 ENCOUNTER — Ambulatory Visit (HOSPITAL_COMMUNITY): Payer: BLUE CROSS/BLUE SHIELD | Admitting: Anesthesiology

## 2018-06-25 ENCOUNTER — Other Ambulatory Visit: Payer: Self-pay

## 2018-06-25 ENCOUNTER — Ambulatory Visit (HOSPITAL_COMMUNITY)
Admission: RE | Admit: 2018-06-25 | Discharge: 2018-06-25 | Disposition: A | Discharge status: Home / Self Care | Payer: BLUE CROSS/BLUE SHIELD | Source: Ambulatory Visit | Attending: Internal Medicine | Admitting: Internal Medicine

## 2018-06-25 ENCOUNTER — Encounter (HOSPITAL_COMMUNITY): Payer: Self-pay | Admitting: *Deleted

## 2018-06-25 ENCOUNTER — Encounter (HOSPITAL_COMMUNITY)
Admission: RE | Disposition: A | Discharge status: Home / Self Care | Payer: Self-pay | Source: Ambulatory Visit | Attending: Internal Medicine

## 2018-06-25 DIAGNOSIS — Z6836 Body mass index (BMI) 36.0-36.9, adult: Secondary | ICD-10-CM | POA: Insufficient documentation

## 2018-06-25 DIAGNOSIS — D131 Benign neoplasm of stomach: Secondary | ICD-10-CM

## 2018-06-25 DIAGNOSIS — K219 Gastro-esophageal reflux disease without esophagitis: Secondary | ICD-10-CM | POA: Diagnosis not present

## 2018-06-25 DIAGNOSIS — Z1211 Encounter for screening for malignant neoplasm of colon: Secondary | ICD-10-CM | POA: Diagnosis not present

## 2018-06-25 DIAGNOSIS — D128 Benign neoplasm of rectum: Secondary | ICD-10-CM | POA: Diagnosis not present

## 2018-06-25 DIAGNOSIS — Z86711 Personal history of pulmonary embolism: Secondary | ICD-10-CM | POA: Insufficient documentation

## 2018-06-25 DIAGNOSIS — Z9049 Acquired absence of other specified parts of digestive tract: Secondary | ICD-10-CM | POA: Insufficient documentation

## 2018-06-25 DIAGNOSIS — E669 Obesity, unspecified: Secondary | ICD-10-CM | POA: Diagnosis not present

## 2018-06-25 DIAGNOSIS — K317 Polyp of stomach and duodenum: Secondary | ICD-10-CM

## 2018-06-25 DIAGNOSIS — Z86718 Personal history of other venous thrombosis and embolism: Secondary | ICD-10-CM | POA: Insufficient documentation

## 2018-06-25 DIAGNOSIS — Z980 Intestinal bypass and anastomosis status: Secondary | ICD-10-CM | POA: Diagnosis not present

## 2018-06-25 DIAGNOSIS — Z8249 Family history of ischemic heart disease and other diseases of the circulatory system: Secondary | ICD-10-CM | POA: Insufficient documentation

## 2018-06-25 DIAGNOSIS — M1990 Unspecified osteoarthritis, unspecified site: Secondary | ICD-10-CM | POA: Diagnosis not present

## 2018-06-25 DIAGNOSIS — D132 Benign neoplasm of duodenum: Secondary | ICD-10-CM | POA: Diagnosis not present

## 2018-06-25 DIAGNOSIS — K621 Rectal polyp: Secondary | ICD-10-CM | POA: Diagnosis not present

## 2018-06-25 DIAGNOSIS — D126 Benign neoplasm of colon, unspecified: Secondary | ICD-10-CM | POA: Diagnosis not present

## 2018-06-25 DIAGNOSIS — Z79899 Other long term (current) drug therapy: Secondary | ICD-10-CM | POA: Diagnosis not present

## 2018-06-25 HISTORY — PX: FLEXIBLE SIGMOIDOSCOPY: SHX5431

## 2018-06-25 HISTORY — PX: ESOPHAGOGASTRODUODENOSCOPY (EGD) WITH PROPOFOL: SHX5813

## 2018-06-25 HISTORY — PX: BIOPSY: SHX5522

## 2018-06-25 SURGERY — ESOPHAGOGASTRODUODENOSCOPY (EGD) WITH PROPOFOL
Anesthesia: Monitor Anesthesia Care

## 2018-06-25 MED ORDER — PROPOFOL 10 MG/ML IV BOLUS
INTRAVENOUS | Status: AC
  Filled 2018-06-25: qty 60

## 2018-06-25 MED ORDER — LIDOCAINE 2% (20 MG/ML) 5 ML SYRINGE
INTRAMUSCULAR | Status: DC | PRN
  Administered 2018-06-25: 100 mg via INTRAVENOUS

## 2018-06-25 MED ORDER — LACTATED RINGERS IV SOLN
INTRAVENOUS | Status: DC
  Administered 2018-06-25: 1000 mL via INTRAVENOUS

## 2018-06-25 MED ORDER — PROPOFOL 10 MG/ML IV BOLUS
INTRAVENOUS | Status: DC | PRN
  Administered 2018-06-25: 50 mg via INTRAVENOUS

## 2018-06-25 MED ORDER — PROPOFOL 500 MG/50ML IV EMUL
INTRAVENOUS | Status: DC | PRN
  Administered 2018-06-25: 125 ug/kg/min via INTRAVENOUS

## 2018-06-25 MED ORDER — SODIUM CHLORIDE 0.9 % IV SOLN: INTRAVENOUS | Status: DC

## 2018-06-25 SURGICAL SUPPLY — 14 items

## 2018-06-25 NOTE — Op Note (Addendum)
[facility] Patient Name: Emma Lutz Procedure Date: 06/25/2018 MRN: [id number] Attending MD: Gatha Mayer , MD Date of Birth: [birth year] CSN: [id number] Age: 62 Admit Type: Outpatient Procedure:                Flexible Sigmoidoscopy - actually ileoscopy and                            proctoscopy Indications:              FAP Providers:                Gatha Mayer, MD, Cleda Daub, RN, Charolette Child, Technician, Stephanie [place] CRNA Referring MD:              Medicines:                Monitored Anesthesia Care Complications:            No immediate complications. Estimated Blood Loss:     Estimated blood loss was minimal. Estimated blood                            loss was minimal. Procedure:                Pre-Anesthesia Assessment:                           - Prior to the procedure, a History and Physical                            was performed, and patient medications and                            allergies were reviewed. The patient's tolerance of                            previous anesthesia was also reviewed. The risks                            and benefits of the procedure and the sedation                            options and risks were discussed with the patient.                            All questions were answered, and informed consent                            was obtained. Prior Anticoagulants: The patient has                            taken no previous anticoagulant or antiplatelet                            agents. ASA Grade  Assessment: III - A patient with                            severe systemic disease. After reviewing the risks                            and benefits, the patient was deemed in                            satisfactory condition to undergo the procedure.                           After obtaining informed consent, the scope was                            passed under direct vision. The  [id number]-[id number] ([id number])                            Olympus adult endoscope was introduced through the                            anus and advanced to the the ileo-rectal                            anastomosis. Scope In: Scope Out: Findings:      The perianal and digital rectal examinations were normal.      A few sessile polyps were found in the rectum. The polyps were       diminutive in size. These were biopsied with a cold forceps for       histology. Verification of patient identification for the specimen was       done. Estimated blood loss was minimal.      The exam was otherwise without abnormality. Impression:               - A few diminutive polyps in the rectum - stripe.                            Biopsied.                           - The examination was otherwise normal. Moderate Sedation:      N/A- Per Anesthesia Care Recommendation:           - Patient has a contact number available for                            emergencies. The signs and symptoms of potential                            delayed complications were discussed with the                            patient. Return to normal activities tomorrow.  Written discharge instructions were provided to the                            patient.                           - Resume previous diet. Procedure Code(s):        --- Professional ---                           [id number], Sigmoidoscopy, flexible; with biopsy, single                            or multiple Diagnosis Code(s):        --- Professional ---                           K62.1, Rectal polyp CPT copyright 2017 American Medical Association. All rights reserved. The codes documented in this report are preliminary and upon coder review may  be revised to meet current compliance requirements. Gatha Mayer, MD 06/25/2018 9:38:01 AM This report has been signed electronically. Number of Addenda: 0

## 2018-06-25 NOTE — Anesthesia Postprocedure Evaluation (Signed)
Anesthesia Post Note  Patient: Emma Lutz  Procedure(s) Performed: ESOPHAGOGASTRODUODENOSCOPY (EGD) WITH PROPOFOL (N/A ) FLEXIBLE SIGMOIDOSCOPY (N/A ) BIOPSY     Patient location during evaluation: PACU Anesthesia Type: MAC Level of consciousness: awake and alert Pain management: pain level controlled Vital Signs Assessment: post-procedure vital signs reviewed and stable Respiratory status: spontaneous breathing, nonlabored ventilation, respiratory function stable and patient connected to nasal cannula oxygen Cardiovascular status: stable and blood pressure returned to baseline Postop Assessment: no apparent nausea or vomiting Anesthetic complications: no    Last Vitals:  Vitals:   06/25/18 0950 06/25/18 1000  BP: 129/84 128/79  Pulse: 73 72  Resp: 14 (!) 23  Temp:    SpO2: 100% 97%    Last Pain:  Vitals:   06/25/18 1000  TempSrc:   PainSc: 0-No pain                 Effie Berkshire

## 2018-06-25 NOTE — Discharge Instructions (Signed)
° °  As we discussed nothing seems new except polyp in duodenum.  I will let you know.  I appreciate the opportunity to care for you. Gatha Mayer, MD, FACG     YOU HAD AN ENDOSCOPIC PROCEDURE TODAY: Refer to the procedure report and other information in the discharge instructions given to you for any specific questions about what was found during the examination. If this information does not answer your questions, please call [place] office at [phone] to clarify.   YOU SHOULD EXPECT: Some feelings of bloating in the abdomen. Passage of more gas than usual. Walking can help get rid of the air that was put into your GI tract during the procedure and reduce the bloating. If you had a lower endoscopy (such as a colonoscopy or flexible sigmoidoscopy) you may notice spotting of blood in your stool or on the toilet paper. Some abdominal soreness may be present for a day or two, also.  DIET: Your first meal following the procedure should be a light meal and then it is ok to progress to your normal diet. A half-sandwich or bowl of soup is an example of a good first meal. Heavy or fried foods are harder to digest and may make you feel nauseous or bloated. Drink plenty of fluids but you should avoid alcoholic beverages for 24 hours. If you had a esophageal dilation, please see attached instructions for diet.    ACTIVITY: Your care partner should take you home directly after the procedure. You should plan to take it easy, moving slowly for the rest of the day. You can resume normal activity the day after the procedure however YOU SHOULD NOT DRIVE, use power tools, machinery or perform tasks that involve climbing or major physical exertion for 24 hours (because of the sedation medicines used during the test).   SYMPTOMS TO REPORT IMMEDIATELY: A gastroenterologist can be reached at any hour. Please call [phone]  for any of the following symptoms:  Following lower endoscopy (colonoscopy,  flexible sigmoidoscopy) Excessive amounts of blood in the stool  Significant tenderness, worsening of abdominal pains  Swelling of the abdomen that is new, acute  Fever of 100 or higher  Following upper endoscopy (EGD, EUS, ERCP, esophageal dilation) Vomiting of blood or coffee ground material  New, significant abdominal pain  New, significant chest pain or pain under the shoulder blades  Painful or persistently difficult swallowing  New shortness of breath  Black, tarry-looking or red, bloody stools  FOLLOW UP:  If any biopsies were taken you will be contacted by phone or by letter within the next 1-3 weeks. Call [phone]  if you have not heard about the biopsies in 3 weeks.  Please also call with any specific questions about appointments or follow up tests.

## 2018-06-25 NOTE — Op Note (Addendum)
[facility] Patient Name: Emma Lutz Procedure Date: 06/25/2018 MRN: [id number] Attending MD: Gatha Mayer , MD Date of Birth: Jan 04, 1956 CSN: [id number] Age: 62 Admit Type: Inpatient Procedure:                Upper GI endoscopy Indications:              Familial Adenomatous Polyposis Providers:                Gatha Mayer, MD, Cleda Daub, RN, Charolette Child, Technician, Stephanie [place] CRNA Referring MD:              Medicines:                Monitored Anesthesia Care Complications:            No immediate complications. Estimated Blood Loss:     Estimated blood loss was minimal. Procedure:                Pre-Anesthesia Assessment:                           - Prior to the procedure, a History and Physical                            was performed, and patient medications and                            allergies were reviewed. The patient's tolerance of                            previous anesthesia was also reviewed. The risks                            and benefits of the procedure and the sedation                            options and risks were discussed with the patient.                            All questions were answered, and informed consent                            was obtained. Prior Anticoagulants: The patient has                            taken no previous anticoagulant or antiplatelet                            agents. ASA Grade Assessment: III - A patient with                            severe systemic disease. After reviewing the risks  and benefits, the patient was deemed in                            satisfactory condition to undergo the procedure.                           After obtaining informed consent, the endoscope was                            passed under direct vision. Throughout the                            procedure, the patient's blood pressure, pulse, and                    oxygen saturations were monitored continuously. The                            GIF-H190 ([id number]) Olympus adult endoscope was                            introduced through the mouth, and advanced to the                            second part of duodenum. The upper GI endoscopy was                            accomplished without difficulty. The patient                            tolerated the procedure well. Scope In: Scope Out: Findings:      Too numerous to count 1 to 15 mm semi-sessile polyps with no bleeding       and no stigmata of recent bleeding were found in the cardia, in the       gastric fundus and in the gastric body. Biopsies were taken with a cold       forceps for histology. Verification of patient identification for the       specimen was done. Estimated blood loss was minimal.      A single 10 mm sessile polyp was found in the second portion of the       duodenum. Biopsies were taken with a cold forceps for histology.       Verification of patient identification for the specimen was done.       Estimated blood loss was minimal.      There was evidence of a widely patent ampullectomy in the major papilla.       This was characterized by healthy appearing mucosa.      Two diminutive sessile polyps were found in the area of the papilla. The       polyp was removed with a cold biopsy forceps. Resection and retrieval       were complete. Verification of patient identification for the specimen       was done. Estimated blood loss was minimal. Impression:               - Too numerous to count gastric polyps. Biopsied.                           -  A single duodenal polyp. Biopsied.                           - Widely patent ampullectomy, characterized by                            healthy appearing mucosa was found.                           - Two duodenal polyps. Resected and retrieved.                           Duodenoscope used to examine papilla Moderate  Sedation:      N/A- Per Anesthesia Care Recommendation:           - Patient has a contact number available for                            emergencies. The signs and symptoms of potential                            delayed complications were discussed with the                            patient. Return to normal activities tomorrow.                            Written discharge instructions were provided to the                            patient.                           - Resume previous diet.                           - Continue present medications.                           - Repeat upper endoscopy for surveillance based on                            pathology results.                           - See the other procedure note for documentation of                            additional recommendations.                           cc Dr. Obie Dredge Procedure Code(s):        --- Professional ---                           [phone], Esophagogastroduodenoscopy, flexible,  transoral; with biopsy, single or multiple Diagnosis Code(s):        --- Professional ---                           K31.7, Polyp of stomach and duodenum                           Z98.890, Other specified postprocedural states                           D12.6, Benign neoplasm of colon, unspecified CPT copyright 2017 American Medical Association. All rights reserved. The codes documented in this report are preliminary and upon coder review may  be revised to meet current compliance requirements. Gatha Mayer, MD 06/25/2018 9:32:05 AM This report has been signed electronically. Number of Addenda: 0

## 2018-06-25 NOTE — H&P (Signed)
[facility] History and Physical   Reason for Procedure:   Surveillance of polyposis  Plan:    EGD, proctoscopy     HPI: Emma Lutz is a 62 y.o. female with FAP here for surveillance of polyps.   Past Medical History:  Diagnosis Date  . Allergy   . Anemia yrs ago   no recent iron use  . Clotting disorder ([place]) 2012   post op blood clots in lungs  . Familial adenomatous polyposis    subtotal colectomy, proctectomy (5/12),  duodenal and gastric adenomas, followed by [facility] ([place])  . GERD (gastroesophageal reflux disease)   . Osteoarthritis   . Pancreatitis 2012  . PONV (postoperative nausea and vomiting) 2012   nausea after last surgery  . Pulmonary embolism ([place]) 02/2012   multiple  . Sessile rectal polyp   . Tubular adenoma    gastric, multiple    Past Surgical History:  Procedure Laterality Date  . APPENDECTOMY    . CARPAL TUNNEL RELEASE Left   . ELBOW SURGERY Right   . ESOPHAGOGASTRODUODENOSCOPY    . ESOPHAGOGASTRODUODENOSCOPY N/A 10/16/2014   Procedure: ESOPHAGOGASTRODUODENOSCOPY (EGD);  Surgeon: Gatha Mayer, MD;  Location: [facility];  Service: Endoscopy;  Laterality: N/A;  need ercp scope  . ESOPHAGOGASTRODUODENOSCOPY N/A 12/24/2015   Procedure: ESOPHAGOGASTRODUODENOSCOPY (EGD);  Surgeon: Gatha Mayer, MD;  Location: [facility];  Service: Endoscopy;  Laterality: N/A;  May need ERCP scope  . ESOPHAGOGASTRODUODENOSCOPY (EGD) WITH PROPOFOL N/A 01/10/2017   Procedure: ESOPHAGOGASTRODUODENOSCOPY (EGD) WITH PROPOFOL;  Surgeon: Gatha Mayer, MD;  Location: [facility];  Service: Endoscopy;  Laterality: N/A;  . FLEXIBLE SIGMOIDOSCOPY    . FLEXIBLE SIGMOIDOSCOPY N/A 10/16/2014   Procedure: FLEXIBLE SIGMOIDOSCOPY;  Surgeon: Gatha Mayer, MD;  Location: [facility];  Service: Endoscopy;  Laterality: N/A;  . FLEXIBLE SIGMOIDOSCOPY N/A 12/24/2015   Procedure: FLEXIBLE SIGMOIDOSCOPY;  Surgeon: Gatha Mayer, MD;  Location: [facility];   Service: Endoscopy;  Laterality: N/A;  . FLEXIBLE SIGMOIDOSCOPY N/A 01/10/2017   Procedure: FLEXIBLE SIGMOIDOSCOPY;  Surgeon: Gatha Mayer, MD;  Location: [facility];  Service: Endoscopy;  Laterality: N/A;  . HERNIA REPAIR    . ILEOSTOMY CLOSURE  08/17/11   Dr. Brooke Pace  . LUMBAR SPINE SURGERY     X 2  . PROCTECTOMY  02/16/11   with loop ileostomy Drt. Gray  . ROTATOR CUFF REPAIR Right 1990  . TOTAL COLECTOMY  1976   with ileostomy/ileo-rectal anastomosis  . TUBAL LIGATION      Prior to Admission medications   Medication Sig Start Date End Date Taking? Authorizing Provider  celecoxib (CELEBREX) 400 MG capsule TAKE 1 CAPSULE BY MOUTH TWICE DAILY 10/16/17  Yes Gatha Mayer, MD  cholecalciferol (VITAMIN D) 1000 units tablet Take 1,000 Units by mouth daily.   Yes [provider]  lansoprazole (PREVACID) 30 MG capsule TAKE 1 CAPSULE BY MOUTH ONCE DAILY 12/04/17  Yes Gatha Mayer, MD  loratadine (CLARITIN) 10 MG tablet Take 10 mg by mouth daily as needed for allergies.   Yes [provider]  PARoxetine (PAXIL-CR) 25 MG 24 hr tablet Take 25 mg by mouth daily.  09/06/14  Yes [provider]  Probiotic CAPS Take 1 capsule by mouth daily.   Yes [provider]    Current Facility-Administered Medications  Medication Dose Route Frequency Provider Last Rate Last Dose  . 0.9 %  sodium chloride infusion   Intravenous Continuous Gatha Mayer, MD      .  lactated ringers infusion   Intravenous Continuous Gatha Mayer, MD 10 mL/hr at 06/25/18 0734 1,000 mL at 06/25/18 0734    Allergies as of 05/30/2018 - Review Complete 03/20/2018  Allergen Reaction Noted  . Sulfonamide derivatives Rash 03/26/2010    Family History  Problem Relation Age of Onset  . Heart disease Mother   . Emphysema Mother   . Familial polyposis Father   . Esophageal cancer Father   . Colon cancer Father   . Diabetes Father   . Colon polyps Father   . Kidney  disease Father   . Transient ischemic attack Father   . Brain cancer Sister   . Rectal cancer Neg Hx   . Stomach cancer Neg Hx     Social History   Social History Narrative   The patient is widowed she has 2 daughters and grandchildren that live in [place]   She works as a Hotel manager support person   2-3 caffeinated beverages daily     Review of Systems: All other review of systems negative except as mentioned in the HPI.  Physical Exam: Vital signs in last 24 hours: Temp:  [98.2 F (36.8 C)] 98.2 F (36.8 C) (09/09 0728) Pulse Rate:  [100] 100 (09/09 0728) Resp:  [18] 18 (09/09 0728) BP: (172)/(106) 172/106 (09/09 0728) SpO2:  [99 %] 99 % (09/09 0728) Weight:  [90.7 kg] 90.7 kg (09/09 0728)   General:   Alert,  Well-developed, well-nourished, pleasant and cooperative in NAD Lungs:  Clear throughout to auscultation.   Heart:  Regular rate and rhythm; no murmurs, clicks, rubs,  or gallops. Abdomen:  Soft, nontender and nondistended. Normal bowel sounds.   Neuro/Psych:  Alert and cooperative. Normal mood and affect. A and O x 3   @Electra Paladino  Simonne Maffucci, MD, [facility] [phone] (pager) 06/25/2018 8:12 AM@

## 2018-06-25 NOTE — Transfer of Care (Signed)
Immediate Anesthesia Transfer of Care Note  Patient: Emma Lutz  Procedure(s) Performed: ESOPHAGOGASTRODUODENOSCOPY (EGD) WITH PROPOFOL (N/A ) FLEXIBLE SIGMOIDOSCOPY (N/A ) BIOPSY  Patient Location: PACU and Endoscopy Unit  Anesthesia Type:MAC  Level of Consciousness: awake, alert  and oriented  Airway & Oxygen Therapy: Patient Spontanous Breathing and Patient connected to nasal cannula oxygen  Post-op Assessment: Report given to RN and Post -op Vital signs reviewed and stable  Post vital signs: Reviewed and stable  Last Vitals:  Vitals Value Taken Time  BP    Temp    Pulse 75 06/25/2018  9:26 AM  Resp 19 06/25/2018  9:26 AM  SpO2 100 % 06/25/2018  9:26 AM  Vitals shown include unvalidated device data.  Last Pain:  Vitals:   06/25/18 0728  TempSrc: Oral  PainSc: 0-No pain         Complications: No apparent anesthesia complications

## 2018-06-25 NOTE — Anesthesia Preprocedure Evaluation (Signed)
Anesthesia Evaluation  Patient identified by MRN, date of birth, ID band Patient awake    Reviewed: Allergy & Precautions, NPO status , Patient's Chart, lab work & pertinent test results  History of Anesthesia Complications (+) PONV  Airway Mallampati: II  TM Distance: >3 FB Neck ROM: Full    Dental  (+) Partial Upper, Dental Advisory Given   Pulmonary neg pulmonary ROS,    breath sounds clear to auscultation       Cardiovascular negative cardio ROS   Rhythm:Regular Rate:Normal     Neuro/Psych negative neurological ROS  negative psych ROS   GI/Hepatic Neg liver ROS, GERD  Medicated,  Endo/Other  negative endocrine ROS  Renal/GU negative Renal ROS     Musculoskeletal  (+) Arthritis ,   Abdominal (+) + obese,   Peds  Hematology negative hematology ROS (+)   Anesthesia Other Findings   Reproductive/Obstetrics                             Anesthesia Physical Anesthesia Plan  ASA: III  Anesthesia Plan: MAC   Post-op Pain Management:    Induction: Intravenous  PONV Risk Score and Plan: Propofol infusion  Airway Management Planned: Natural Airway and Nasal Cannula  Additional Equipment: None  Intra-op Plan:   Post-operative Plan:   Informed Consent: I have reviewed the patients History and Physical, chart, labs and discussed the procedure including the risks, benefits and alternatives for the proposed anesthesia with the patient or authorized representative who has indicated his/her understanding and acceptance.     Plan Discussed with: CRNA  Anesthesia Plan Comments:         Anesthesia Quick Evaluation

## 2018-06-26 ENCOUNTER — Encounter (HOSPITAL_COMMUNITY): Payer: Self-pay | Admitting: Internal Medicine

## 2018-07-04 NOTE — Progress Notes (Signed)
I am sending a My Chart message  She has precancerous polyps (as in the past)  I am going to discuss with Dr. Harl Bowie and get back to her about management

## 2018-07-05 ENCOUNTER — Other Ambulatory Visit: Payer: Self-pay | Admitting: Internal Medicine

## 2018-07-06 NOTE — Telephone Encounter (Signed)
This encounter was created in error - please disregard.

## 2018-07-25 DIAGNOSIS — L089 Local infection of the skin and subcutaneous tissue, unspecified: Secondary | ICD-10-CM | POA: Diagnosis not present

## 2018-07-26 NOTE — Progress Notes (Signed)
Message left that I would call back My Chart message also

## 2018-07-31 NOTE — Telephone Encounter (Signed)
Happy to be available for attempt at duodenal mucosal resection of laterally spreading adenoma. Patty, can we find a spot for this patient in clinic and then for December EMR attempt. Thanks.

## 2018-07-31 NOTE — Telephone Encounter (Signed)
I spoke to her and we plan for:  Appointment with Dr. Rush Landmark in the office to discuss duodenal polypectomy   Anticipate having that done in December (she has met deductible for the year)

## 2018-08-01 ENCOUNTER — Encounter: Payer: Self-pay | Admitting: Gastroenterology

## 2018-08-01 ENCOUNTER — Ambulatory Visit: Payer: BLUE CROSS/BLUE SHIELD | Admitting: Gastroenterology

## 2018-08-01 VITALS — Ht 62.0 in | Wt 208.4 lb

## 2018-08-01 DIAGNOSIS — D126 Benign neoplasm of colon, unspecified: Secondary | ICD-10-CM | POA: Diagnosis not present

## 2018-08-01 DIAGNOSIS — D132 Benign neoplasm of duodenum: Secondary | ICD-10-CM

## 2018-08-01 NOTE — Progress Notes (Signed)
[facility] VISIT   Primary Care Provider Branch, Lucilla Edin, MD [place] [address] [phone]  Referring Provider Branch, Lucilla Edin, MD [place], [place] [address] [phone]  Patient Profile: Emma Lutz is a 62 y.o. female with a pmh significant for FAP (s/p Proctocolectomy & Ampullectomy), GERD, GERD.  The patient presents to the [facility] for an evaluation and management of problem(s) noted below:  Problem List 1. FAP (familial adenomatous polyposis)   2. Duodenal adenoma     History of Present Illness: This is a patient of Dr. Carlean Purl, who is referred for discussion of attempt at advanced polypectomy of a duodenal polyp/adenoma.  Her history is well-documented in prior notes.  She had her FAP surveillance via EGD/pouchoscopy and was found to have a laterally spreading 10 mm sessile polyp in the second portion of the duodenum with biopsies that returned as adenoma.  Her case was discussed with her prior endoscopist Dr. Obie Dredge by Dr. Carlean Purl.  The patient deals with long-standing issues of anxiety.  However she is not having any significant GI symptoms that are out of the ordinary for her at this point in time.  She is hopeful that we can proceed with endoscopy attempt later this year.  He has not noted any significant changes in her weight.  There is no changes in her appetite.  Patient is on celecoxib.  GI Review of Systems Positive as above Negative for dysphasia, odynophagia, change in bowel habits, melena, hematochezia  Review of Systems General: Denies fevers/chills HEENT: Denies oral lesions Cardiovascular: Denies chest pain Pulmonary: Denies shortness of breath Gastroenterological: See HPI Genitourinary: Denies darkened urine Hematological: Positive for easy bruising/bleeding Psychological: Mood is stable but as noted before she does have  anxiety Musculoskeletal: Denies new arthralgias   Medications Current Outpatient Medications  Medication Sig Dispense Refill  . celecoxib (CELEBREX) 400 MG capsule TAKE 1 CAPSULE BY MOUTH TWICE DAILY 60 capsule 2  . cholecalciferol (VITAMIN D) 1000 units tablet Take 1,000 Units by mouth daily.    . lansoprazole (PREVACID) 30 MG capsule TAKE 1 CAPSULE BY MOUTH ONCE DAILY 30 capsule 5  . loratadine (CLARITIN) 10 MG tablet Take 10 mg by mouth daily as needed for allergies.    Marland Kitchen PARoxetine (PAXIL-CR) 25 MG 24 hr tablet Take 25 mg by mouth daily.     . Probiotic CAPS Take 1 capsule by mouth daily.     No current facility-administered medications for this visit.     Allergies Allergies  Allergen Reactions  . Sulfonamide Derivatives Rash    Histories Past Medical History:  Diagnosis Date  . Allergy   . Anemia yrs ago   no recent iron use  . Clotting disorder ([place]) 2012   post op blood clots in lungs  . Familial adenomatous polyposis    subtotal colectomy, proctectomy (5/12),  duodenal and gastric adenomas, followed by [facility] ([place])  . GERD (gastroesophageal reflux disease)   . Osteoarthritis   . Pancreatitis 2012  . PONV (postoperative nausea and vomiting) 2012   nausea after last surgery  . Pulmonary embolism ([place]) 02/2012   multiple  . Sessile rectal polyp   . Tubular adenoma    gastric, multiple   Past Surgical History:  Procedure Laterality Date  . APPENDECTOMY    . BIOPSY  06/25/2018   Procedure: BIOPSY;  Surgeon: Gatha Mayer, MD;  Location: [facility];  Service: Endoscopy;;  . CARPAL TUNNEL  RELEASE Left   . ELBOW SURGERY Right   . ESOPHAGOGASTRODUODENOSCOPY    . ESOPHAGOGASTRODUODENOSCOPY N/A 10/16/2014   Procedure: ESOPHAGOGASTRODUODENOSCOPY (EGD);  Surgeon: Gatha Mayer, MD;  Location: [facility];  Service: Endoscopy;  Laterality: N/A;  need ercp scope  . ESOPHAGOGASTRODUODENOSCOPY N/A 12/24/2015   Procedure: ESOPHAGOGASTRODUODENOSCOPY (EGD);  Surgeon:  Gatha Mayer, MD;  Location: [facility];  Service: Endoscopy;  Laterality: N/A;  May need ERCP scope  . ESOPHAGOGASTRODUODENOSCOPY (EGD) WITH PROPOFOL N/A 01/10/2017   Procedure: ESOPHAGOGASTRODUODENOSCOPY (EGD) WITH PROPOFOL;  Surgeon: Gatha Mayer, MD;  Location: [facility];  Service: Endoscopy;  Laterality: N/A;  . ESOPHAGOGASTRODUODENOSCOPY (EGD) WITH PROPOFOL N/A 06/25/2018   Procedure: ESOPHAGOGASTRODUODENOSCOPY (EGD) WITH PROPOFOL;  Surgeon: Gatha Mayer, MD;  [facility]: [facility];  Service: Endoscopy;  Laterality: N/A;  . FLEXIBLE SIGMOIDOSCOPY    . FLEXIBLE SIGMOIDOSCOPY N/A 10/16/2014   Procedure: FLEXIBLE SIGMOIDOSCOPY;  Surgeon: Gatha Mayer, MD;  [facility];  Service: Endoscopy;  Laterality: N/A;  . FLEXIBLE SIGMOIDOSCOPY N/A 12/24/2015   Procedure: FLEXIBLE SIGMOIDOSCOPY;  Surgeon: Gatha Mayer, MD;  [facility];  Service: Endoscopy;  Laterality: N/A;  . FLEXIBLE SIGMOIDOSCOPY N/A 01/10/2017   Procedure: FLEXIBLE SIGMOIDOSCOPY;  Surgeon: Gatha Mayer, MD;  [facility];  Service: Endoscopy;  Laterality: N/A;  . FLEXIBLE SIGMOIDOSCOPY N/A 06/25/2018   Procedure: FLEXIBLE SIGMOIDOSCOPY;  Surgeon: Gatha Mayer, MD;  [facility];  Service: Endoscopy;  Laterality: N/A;  . HERNIA REPAIR    . ILEOSTOMY CLOSURE  08/17/11   Dr. Brooke Pace  . LUMBAR SPINE SURGERY     X 2  . PROCTECTOMY  02/16/11   with loop ileostomy Drt. [place]  . ROTATOR CUFF REPAIR Right 1990  . TOTAL COLECTOMY  1976   with ileostomy/ileo-rectal anastomosis  . TUBAL LIGATION     Social History   Socioeconomic History  . Marital status: Married    Spouse name: Not on file  . Number of children: 2  . Years of education: Not on file  . Highest education level: Not on file  Occupational History  . Occupation: employed    Fish farm manager: Office manager  . Occupation: Probation officer  Social Needs  . Financial resource strain: Not on  file  . Food insecurity:    Worry: Not on file    Inability: Not on file  . Transportation needs:    Medical: Not on file    Non-medical: Not on file  Tobacco Use  . Smoking status: Never Smoker  . Smokeless tobacco: Never Used  Substance and Sexual Activity  . Alcohol use: No    Alcohol/week: 0.0 standard drinks  . Drug use: No  . Sexual activity: Not on file  Lifestyle  . Physical activity:    Days per week: Not on file    Minutes per session: Not on file  . Stress: Not on file  Relationships  . Social connections:    Talks on phone: Not on file    Gets together: Not on file    Attends religious service: Not on file    Active member of club or organization: Not on file    Attends meetings of clubs or organizations: Not on file    Relationship status: Not on file  . Intimate partner violence:    Fear of current or ex partner: Not on file    Emotionally abused: Not on file    Physically abused: Not on file  Forced sexual activity: Not on file  Other Topics Concern  . Not on file  Social History Narrative   The patient is widowed she has 2 daughters and grandchildren that live in [place]   She works as a Hotel manager support person   2-3 caffeinated beverages daily   Family History  Problem Relation Age of Onset  . Heart disease Mother   . Emphysema Mother   . Familial polyposis Father   . Esophageal cancer Father   . Colon cancer Father   . Diabetes Father   . Colon polyps Father   . Kidney disease Father   . Transient ischemic attack Father   . Brain cancer Sister   . Rectal cancer Neg Hx   . Stomach cancer Neg Hx   . Inflammatory bowel disease Neg Hx   . Liver disease Neg Hx   . Pancreatic cancer Neg Hx    I have reviewed her medical, social, and family history in detail and updated the electronic medical record as necessary.    PHYSICAL EXAMINATION  Ht 5\' 2"  (1.575 m)   Wt 208 lb 6.4 oz (94.5 kg)   BMI 38.12 kg/m  Wt Readings from Last 3  Encounters:  08/01/18 208 lb 6.4 oz (94.5 kg)  06/25/18 200 lb (90.7 kg)  06/20/18 214 lb (97.1 kg)  GEN: NAD, appears stated age, doesn't appear chronically ill PSYCH: Cooperative, without pressured speech EYE: Conjunctivae pink, sclerae anicteric ENT: MMM, without oral ulcers NECK: Supple CV: RR without R/Gs  RESP: CTAB posteriorly, without wheezing GI: NABS, soft, surgical scars present that are well-healed, NT/ND, without rebound or guarding, no HSM appreciated MSK/EXT: No lower extremity edema SKIN: No jaundice NEURO:  Alert & Oriented x 3, no focal deficits   REVIEW OF DATA  I reviewed the following data at the time of this encounter:  GI Procedures and Studies  9/19 EGD Too numerous to count 1 to 15 mm semi-sessile polyps with no bleeding and no stigmata of recent bleeding were found in the cardia, in the gastric fundus and in the gastric body. Biopsies were taken with a cold forceps for histology. Verification of patient identification for the specimen was done. Estimated blood loss was minimal. Findings: A single 10 mm sessile polyp was found in the second portion of the duodenum. Biopsies were taken with a cold forceps for histology. Verification of patient identification for the specimen was done. Estimated blood loss was minimal. There was evidence of a widely patent ampullectomy in the major papilla. This was characterized by healthy appearing mucosa. Two diminutive sessile polyps were found in the area of the papilla. The polyp was removed with a cold biopsy forceps. Resection and retrieval were complete. Verification of patient identification for the specimen was done. Estimated blood loss was minimal. - Too numerous to count gastric polyps. Biopsied - A single duodenal polyp. Biopsied. - Widely patent ampullectomy, characterized by healthy appearing mucosa was found. - Two duodenal polyps. Resected and retrieved. Duodenoscope used to examine papilla  9/19  Pouchoscopy - A few diminutive polyps in the rectum - stripe. Biopsied. - The examination was otherwise normal.  Laboratory Studies  Reviewed in EPIC  Imaging Studies  No   ASSESSMENT  Ms. Lorio is a 62 y.o. female with a pmh significant for FAP (s/p Proctocolectomy & Ampullectomy), GERD, GERD.  The patient is seen today for evaluation and management of:  1. FAP (familial adenomatous polyposis)   2. Duodenal adenoma    This  is a clinically and hemodynamically stable patient.  Based upon the description and endoscopic pictures I do feel that it is reasonable to pursue an Advanced Polypectomy attempt of the polyp/lesion.  We discussed some of the techniques of advanced polypectomy which include Endoscopic Mucosal Resection, OVESCO Full-Thickness Resection, Endorotor Morcellation, and Tissue Ablation via Fulguration.  The risks and benefits of endoscopic evaluation were discussed with the patient; these include but are not limited to the risk of perforation, infection, bleeding, missed lesions, lack of diagnosis, severe illness requiring hospitalization, as well as anesthesia and sedation related illnesses.  During attempts at advanced polypectomy, the risks of bleeding and perforation/leak are increased as opposed to diagnostic and screening endoscopies, and that was discussed with the patient as well.  She does not want a referral to surgery to discuss consideration of a surgical intervention.  If, after attempt at removal of the polyp, it is found that the patient has a complication or that an invasive lesion or malignant lesion is found, or that the polyp continues to recur, the patient is aware and understands that a surgery may be indicated/required.  In addition, with the possible need for piecemeal resection, subsequent short-interval endoscopies for follow up and treatment of the lesion may be necessary and was also explained.  All patient questions were answered, to the best of my ability,  and the patient agrees to the aforementioned plan of action with follow-up as indicated.     PLAN  1. Duodenal adenoma - Proceed with scheduling EGD with EMR attempt  2. FAP (familial adenomatous polyposis) - Follow up as per protocol and with Dr. Carlean Purl   No orders of the defined types were placed in this encounter.   New Prescriptions   No medications on file   Modified Medications   No medications on file    Planned Follow Up: No follow-ups on file.   Justice Britain, MD [facility] Advanced Endoscopy Office # [phone]

## 2018-08-01 NOTE — Patient Instructions (Signed)
We will contact you to schedule your Upper Endoscopy with EMR at the hospital when December schedule comes out.

## 2018-08-02 ENCOUNTER — Encounter: Payer: Self-pay | Admitting: Gastroenterology

## 2018-08-02 DIAGNOSIS — D132 Benign neoplasm of duodenum: Secondary | ICD-10-CM | POA: Insufficient documentation

## 2018-08-02 HISTORY — DX: Benign neoplasm of duodenum: D13.2

## 2018-08-07 NOTE — Addendum Note (Signed)
Addended by: Marzella Schlein on: 08/07/2018 11:14 AM   Modules accepted: Orders

## 2018-09-14 ENCOUNTER — Other Ambulatory Visit: Payer: Self-pay

## 2018-09-14 ENCOUNTER — Encounter (HOSPITAL_COMMUNITY): Payer: Self-pay | Admitting: *Deleted

## 2018-09-14 NOTE — Progress Notes (Signed)
Pt denies SOB, chest pain, and being under the care of a cardiologist. Pt denies having a stress test, echo and cardiac cath. Pt denies having an EKG and chest x ray within the last year. Pt denies recent labs. Pt made aware to stop taking  Aspirin (unless advised otherwise by surgeon), vitamins, fish oil, Probiotics and herbal medications. Do not take any NSAIDs ie: Ibuprofen, Advil, Naproxen(Aleve), Motrin, Celebrex, BC and Goody Powder. Pt verbalized understanding of all pre-op instructions.

## 2018-09-17 ENCOUNTER — Encounter (HOSPITAL_COMMUNITY)
Admission: RE | Disposition: A | Discharge status: Home / Self Care | Payer: Self-pay | Source: Ambulatory Visit | Attending: Gastroenterology

## 2018-09-17 ENCOUNTER — Ambulatory Visit (HOSPITAL_COMMUNITY)
Admission: RE | Admit: 2018-09-17 | Discharge: 2018-09-17 | Disposition: A | Discharge status: Home / Self Care | Payer: BLUE CROSS/BLUE SHIELD | Source: Ambulatory Visit | Attending: Gastroenterology | Admitting: Gastroenterology

## 2018-09-17 ENCOUNTER — Ambulatory Visit (HOSPITAL_COMMUNITY): Payer: BLUE CROSS/BLUE SHIELD | Admitting: Anesthesiology

## 2018-09-17 ENCOUNTER — Encounter (HOSPITAL_COMMUNITY): Payer: Self-pay | Admitting: *Deleted

## 2018-09-17 ENCOUNTER — Other Ambulatory Visit: Payer: Self-pay

## 2018-09-17 DIAGNOSIS — R7303 Prediabetes: Secondary | ICD-10-CM | POA: Insufficient documentation

## 2018-09-17 DIAGNOSIS — K317 Polyp of stomach and duodenum: Secondary | ICD-10-CM

## 2018-09-17 HISTORY — PX: ESOPHAGOGASTRODUODENOSCOPY (EGD) WITH PROPOFOL: SHX5813

## 2018-09-17 HISTORY — DX: Prediabetes: R73.03

## 2018-09-17 HISTORY — DX: Presence of dental prosthetic device (complete) (partial): Z97.2

## 2018-09-17 HISTORY — DX: Presence of spectacles and contact lenses: Z97.3

## 2018-09-17 SURGERY — ESOPHAGOGASTRODUODENOSCOPY (EGD) WITH PROPOFOL
Anesthesia: Monitor Anesthesia Care

## 2018-09-17 MED ORDER — PROPOFOL 10 MG/ML IV BOLUS
INTRAVENOUS | Status: DC | PRN
  Administered 2018-09-17 (×2): 20 mg via INTRAVENOUS
  Administered 2018-09-17: 30 mg via INTRAVENOUS
  Administered 2018-09-17 (×2): 20 mg via INTRAVENOUS

## 2018-09-17 MED ORDER — EPINEPHRINE PF 1 MG/10ML IJ SOSY
PREFILLED_SYRINGE | INTRAMUSCULAR | Status: AC
  Filled 2018-09-17: qty 20

## 2018-09-17 MED ORDER — SPOT INK MARKER SYRINGE KIT
PACK | SUBMUCOSAL | Status: AC
  Filled 2018-09-17: qty 10

## 2018-09-17 MED ORDER — ONDANSETRON HCL 4 MG/2ML IJ SOLN
INTRAMUSCULAR | Status: DC | PRN
  Administered 2018-09-17: 4 mg via INTRAVENOUS

## 2018-09-17 MED ORDER — GLYCOPYRROLATE 0.2 MG/ML IJ SOLN
INTRAMUSCULAR | Status: DC | PRN
  Administered 2018-09-17: 0.1 mg via INTRAVENOUS

## 2018-09-17 MED ORDER — FENTANYL CITRATE (PF) 100 MCG/2ML IJ SOLN
INTRAMUSCULAR | Status: DC | PRN
  Administered 2018-09-17 (×3): 25 ug via INTRAVENOUS

## 2018-09-17 MED ORDER — PROPOFOL 500 MG/50ML IV EMUL
INTRAVENOUS | Status: DC | PRN
  Administered 2018-09-17: 75 ug/kg/min via INTRAVENOUS

## 2018-09-17 MED ORDER — LACTATED RINGERS IV SOLN
INTRAVENOUS | Status: AC | PRN
  Administered 2018-09-17: 07:00:00 via INTRAVENOUS
  Administered 2018-09-17: 1000 mL via INTRAVENOUS

## 2018-09-17 MED ORDER — LIDOCAINE HCL (CARDIAC) PF 100 MG/5ML IV SOSY
PREFILLED_SYRINGE | INTRAVENOUS | Status: DC | PRN
  Administered 2018-09-17: 100 mg via INTRAVENOUS

## 2018-09-17 MED ORDER — GLUCAGON HCL RDNA (DIAGNOSTIC) 1 MG IJ SOLR
INTRAMUSCULAR | Status: AC
  Filled 2018-09-17: qty 1

## 2018-09-17 SURGICAL SUPPLY — 14 items

## 2018-09-17 NOTE — Anesthesia Postprocedure Evaluation (Signed)
Anesthesia Post Note  Patient: Emma Lutz  Procedure(s) Performed: ESOPHAGOGASTRODUODENOSCOPY (EGD) WITH PROPOFOL (N/A )     Patient location during evaluation: PACU Anesthesia Type: MAC Level of consciousness: awake and alert Pain management: pain level controlled Vital Signs Assessment: post-procedure vital signs reviewed and stable Respiratory status: spontaneous breathing, nonlabored ventilation, respiratory function stable and patient connected to nasal cannula oxygen Cardiovascular status: stable and blood pressure returned to baseline Postop Assessment: no apparent nausea or vomiting Anesthetic complications: no    Last Vitals:  Vitals:   09/17/18 0840 09/17/18 0850  BP: (!) 110/53 120/62  Pulse: 71 80  Resp: 17 19  Temp:    SpO2: 98% 96%    Last Pain:  Vitals:   09/17/18 0850  TempSrc:   PainSc: 0-No pain                 Caylyn Tedeschi

## 2018-09-17 NOTE — Anesthesia Preprocedure Evaluation (Signed)
Anesthesia Evaluation  Patient identified by MRN, date of birth, ID band Patient awake    Reviewed: Allergy & Precautions, NPO status , Patient's Chart, lab work & pertinent test results  History of Anesthesia Complications (+) PONV and history of anesthetic complications  Airway Mallampati: II  TM Distance: >3 FB Neck ROM: Full    Dental  (+) Partial Upper, Dental Advisory Given   Pulmonary neg pulmonary ROS,    breath sounds clear to auscultation       Cardiovascular negative cardio ROS   Rhythm:Regular     Neuro/Psych negative neurological ROS  negative psych ROS   GI/Hepatic Neg liver ROS, GERD  Medicated,  Endo/Other  negative endocrine ROS  Renal/GU negative Renal ROS     Musculoskeletal  (+) Arthritis ,   Abdominal (+) + obese,   Peds  Hematology negative hematology ROS (+)   Anesthesia Other Findings   Reproductive/Obstetrics                             Anesthesia Physical Anesthesia Plan  ASA: II  Anesthesia Plan: MAC   Post-op Pain Management:    Induction: Intravenous  PONV Risk Score and Plan: 3 and Treatment may vary due to age or medical condition, Propofol infusion and TIVA  Airway Management Planned: Nasal Cannula  Additional Equipment: None  Intra-op Plan:   Post-operative Plan:   Informed Consent: I have reviewed the patients History and Physical, chart, labs and discussed the procedure including the risks, benefits and alternatives for the proposed anesthesia with the patient or authorized representative who has indicated his/her understanding and acceptance.   Dental advisory given  Plan Discussed with: CRNA and Surgeon  Anesthesia Plan Comments:         Anesthesia Quick Evaluation

## 2018-09-17 NOTE — H&P (Signed)
GASTROENTEROLOGY OUTPATIENT PROCEDURE H&P NOTE   Primary Care Physician: Sherri Rad, MD  HPI: Emma Lutz is a 62 y.o. female who presents for EGD with EMR.  Past Medical History:  Diagnosis Date  . Allergy   . Anemia yrs ago   no recent iron use  . Clotting disorder ([place]) 2012   post op blood clots in lungs  . Familial adenomatous polyposis    subtotal colectomy, proctectomy (5/12),  duodenal and gastric adenomas, followed by [facility] ([place])  . GERD (gastroesophageal reflux disease)   . Osteoarthritis   . Pancreatitis 2012  . PONV (postoperative nausea and vomiting) 2012   nausea after last surgery  . Pre-diabetes   . Pulmonary embolism ([place]) 02/2012   multiple  . Sessile rectal polyp   . Tubular adenoma    gastric, multiple  . Wears glasses   . Wears partial dentures    Past Surgical History:  Procedure Laterality Date  . APPENDECTOMY    . BIOPSY  06/25/2018   Procedure: BIOPSY;  Surgeon: Gatha Mayer, MD;  Location: [facility];  Service: Endoscopy;;  . CARPAL TUNNEL RELEASE Left   . ELBOW SURGERY Right   . ESOPHAGOGASTRODUODENOSCOPY    . ESOPHAGOGASTRODUODENOSCOPY N/A 10/16/2014   Procedure: ESOPHAGOGASTRODUODENOSCOPY (EGD);  Surgeon: Gatha Mayer, MD;  Location: [facility];  Service: Endoscopy;  Laterality: N/A;  need ercp scope  . ESOPHAGOGASTRODUODENOSCOPY N/A 12/24/2015   Procedure: ESOPHAGOGASTRODUODENOSCOPY (EGD);  Surgeon: Gatha Mayer, MD;  Location: [facility];  Service: Endoscopy;  Laterality: N/A;  May need ERCP scope  . ESOPHAGOGASTRODUODENOSCOPY (EGD) WITH PROPOFOL N/A 01/10/2017   Procedure: ESOPHAGOGASTRODUODENOSCOPY (EGD) WITH PROPOFOL;  Surgeon: Gatha Mayer, MD;  [facility];  Service: Endoscopy;  Laterality: N/A;  . ESOPHAGOGASTRODUODENOSCOPY (EGD) WITH PROPOFOL N/A 06/25/2018   Procedure: ESOPHAGOGASTRODUODENOSCOPY (EGD) WITH PROPOFOL;  Surgeon: Gatha Mayer, MD;  [facility];  Service:  Endoscopy;  Laterality: N/A;  . FLEXIBLE SIGMOIDOSCOPY    . FLEXIBLE SIGMOIDOSCOPY N/A 10/16/2014   Procedure: FLEXIBLE SIGMOIDOSCOPY;  Surgeon: Gatha Mayer, MD;  [facility];  Service: Endoscopy;  Laterality: N/A;  . FLEXIBLE SIGMOIDOSCOPY N/A 12/24/2015   Procedure: FLEXIBLE SIGMOIDOSCOPY;  Surgeon: Gatha Mayer, MD;  [facility];  Service: Endoscopy;  Laterality: N/A;  . FLEXIBLE SIGMOIDOSCOPY N/A 01/10/2017   Procedure: FLEXIBLE SIGMOIDOSCOPY;  Surgeon: Gatha Mayer, MD;  [facility];  Service: Endoscopy;  Laterality: N/A;  . FLEXIBLE SIGMOIDOSCOPY N/A 06/25/2018   Procedure: FLEXIBLE SIGMOIDOSCOPY;  Surgeon: Gatha Mayer, MD;  [facility];  Service: Endoscopy;  Laterality: N/A;  . HERNIA REPAIR    . ILEOSTOMY CLOSURE  08/17/11   Dr. Brooke Pace  . LUMBAR SPINE SURGERY     X 2  . MULTIPLE TOOTH EXTRACTIONS    . PROCTECTOMY  02/16/11   with loop ileostomy Drt. [place]  . ROTATOR CUFF REPAIR Right 1990  . TOTAL COLECTOMY  1976   with ileostomy/ileo-rectal anastomosis  . TUBAL LIGATION     Current Facility-Administered Medications  Medication Dose Route Frequency Provider Last Rate Last Dose  . lactated ringers infusion    Continuous PRN Mansouraty, Telford Nab., MD   1,000 mL at 09/17/18 0655   Allergies  Allergen Reactions  . Sulfonamide Derivatives Rash   Family History  Problem Relation Age of Onset  . Heart disease Mother   . Emphysema Mother   . Familial polyposis Father   . Esophageal cancer Father   .  Colon cancer Father   . Diabetes Father   . Colon polyps Father   . Kidney disease Father   . Transient ischemic attack Father   . Brain cancer Sister   . Rectal cancer Neg Hx   . Stomach cancer Neg Hx   . Inflammatory bowel disease Neg Hx   . Liver disease Neg Hx   . Pancreatic cancer Neg Hx    Social History   Socioeconomic History  . Marital status: Married    Spouse name: Not on file  . Number of  children: 2  . Years of education: Not on file  . Highest education level: Not on file  Occupational History  . Occupation: employed    Fish farm manager: Office manager  . Occupation: Probation officer  Social Needs  . Financial resource strain: Not on file  . Food insecurity:    Worry: Not on file    Inability: Not on file  . Transportation needs:    Medical: Not on file    Non-medical: Not on file  Tobacco Use  . Smoking status: Never Smoker  . Smokeless tobacco: Never Used  Substance and Sexual Activity  . Alcohol use: No    Alcohol/week: 0.0 standard drinks  . Drug use: No  . Sexual activity: Not on file  Lifestyle  . Physical activity:    Days per week: Not on file    Minutes per session: Not on file  . Stress: Not on file  Relationships  . Social connections:    Talks on phone: Not on file    Gets together: Not on file    Attends religious service: Not on file    Active member of club or organization: Not on file    Attends meetings of clubs or organizations: Not on file    Relationship status: Not on file  . Intimate partner violence:    Fear of current or ex partner: Not on file    Emotionally abused: Not on file    Physically abused: Not on file    Forced sexual activity: Not on file  Other Topics Concern  . Not on file  Social History Narrative   The patient is widowed she has 2 daughters and grandchildren that live in [place]   She works as a Hotel manager support person   2-3 caffeinated beverages daily    Physical Exam: Vital signs in last 24 hours: Temp:  [97.8 F (36.6 C)] 97.8 F (36.6 C) (12/02 0637) Resp:  [17] 17 (12/02 0637) BP: (149)/(83) 149/83 (12/02 0637) SpO2:  [95 %] 95 % (12/02 0637) Weight:  [93 kg] 93 kg (12/02 0637)   GEN: NAD EYE: Sclerae anicteric ENT: MMM CV: RR without R/Gs  RESP: CTAB posteriorly GI: Soft, NT/ND NEURO:  Alert & Oriented x 3  Lab Results: No results for input(s): WBC, HGB, HCT, PLT in the  last 72 hours. BMET No results for input(s): NA, K, CL, CO2, GLUCOSE, BUN, CREATININE, CALCIUM in the last 72 hours. LFT No results for input(s): PROT, ALBUMIN, AST, ALT, ALKPHOS, BILITOT, BILIDIR, IBILI in the last 72 hours. PT/INR No results for input(s): LABPROT, INR in the last 72 hours.   Impression / Plan: This is a 62 y.o.female who presents for EGD with EMR.  The risks and benefits of endoscopic evaluation were discussed with the patient; these include but are not limited to the risk of perforation, infection, bleeding, missed lesions, lack of diagnosis, severe illness requiring hospitalization, as  well as anesthesia and sedation related illnesses.  The patient is agreeable to proceed.    Justice Britain, MD [facility] Advanced Endoscopy Office # [phone]

## 2018-09-17 NOTE — Transfer of Care (Signed)
Immediate Anesthesia Transfer of Care Note  Patient: Emma Lutz  Procedure(s) Performed: ESOPHAGOGASTRODUODENOSCOPY (EGD) WITH PROPOFOL (N/A )  Patient Location: Endoscopy Unit  Anesthesia Type:MAC  Level of Consciousness: awake and alert   Airway & Oxygen Therapy: Patient Spontanous Breathing and Patient connected to nasal cannula oxygen  Post-op Assessment: Report given to RN, Post -op Vital signs reviewed and stable and Patient moving all extremities  Post vital signs: Reviewed and stable  Last Vitals:  Vitals Value Taken Time  BP 119/53 09/17/2018  8:23 AM  Temp 36.8 C 09/17/2018  8:23 AM  Pulse 78 09/17/2018  8:23 AM  Resp 18 09/17/2018  8:23 AM  SpO2 97 % 09/17/2018  8:23 AM    Last Pain:  Vitals:   09/17/18 0823  TempSrc: Oral  PainSc: 0-No pain         Complications: No apparent anesthesia complications

## 2018-09-17 NOTE — Op Note (Addendum)
[facility] Patient Name: Emma Lutz Procedure Date : 09/17/2018 MRN: [id number] Attending MD: Justice Britain , MD Date of Birth: 07-Sep-1956 CSN: [id number] Age: 62 Admit Type: Outpatient Procedure:                Upper GI endoscopy Indications:              Therapeutic procedure, Polyps in the duodenum Providers:                Justice Britain, MD, Carlyn Reichert, RN, Elspeth Cho Tech., Technician, Raphael Gibney, CRNA Referring MD:             Gatha Mayer, MD, Lucilla Edin Branch Medicines:                Monitored Anesthesia Care Complications:            No immediate complications. Estimated Blood Loss:     Estimated blood loss was minimal. Procedure:                Pre-Anesthesia Assessment:                           - Prior to the procedure, a History and Physical                            was performed, and patient medications and                            allergies were reviewed. The patient's tolerance of                            previous anesthesia was also reviewed. The risks                            and benefits of the procedure and the sedation                            options and risks were discussed with the patient.                            All questions were answered, and informed consent                            was obtained. Prior Anticoagulants: The patient has                            taken Celebrex (celecoxib). ASA Grade Assessment:                            III - A patient with severe systemic disease. After                            reviewing the risks and benefits, the patient was  deemed in satisfactory condition to undergo the                            procedure.                           After obtaining informed consent, the endoscope was                            passed under direct vision. Throughout the                            procedure, the  patient's blood pressure, pulse, and                            oxygen saturations were monitored continuously. The                            [id number]-H190 ([id number]) Olympus Adult EGD was introduced                            through the mouth, and advanced to the duodenal                            bulb. The upper GI endoscopy was accomplished                            without difficulty. The patient tolerated the                            procedure. The [id number]-[id number] ([id number]) Olympus ERCP was                            introduced through the mouth, and advanced to the.                            The Colonoscope was introduced through the and                            advanced to the. Scope In: Scope Out: Findings:      No gross lesions were noted in the entire esophagus.      Innumberable 1 to 15 mm pedunculated and sessile polyps with no bleeding       and stigmata of recent bleeding were found in the cardia, in the gastric       fundus and in the gastric body. Polypectomy was not attempted. There was       friability with the polyps that were noted, with a significant       proportion of polyps in the distal greater curve of the body.      A few sessile and semi-pedunculated polyps were found in the gastric       antrum (not to the extent of the previously noted polyps).      Fifteen sessile polyps were found in the duodenal bulb, in the first       portion  of the duodenum, in the second portion of the duodenum and in       the third portion of the duodenum. The polyps were 1 to 10 mm in size.      A single 35 mm sessile polyp was found in the second portion of the       duodenum on the lateral wall extending to the superior portion of the       duodenum. This extented to at least 50-60% of the wall of the duodenal       wall visualized under NBI and whitelight with better evaluation with the       Duodenoscope. This is proximal to the previous ampullectomy site.       Positioning with  the EGD scope required a near hubbing of the scope to       the mouth to keep a stable position but did not visualize the superior       portion. Using a pediatric colonoscope, the area can be visualized when       in complete shortened view, but position is difficult to maintain. Using       the duodenoscope, the majority of the superior portion of the lesion can       be visualized but the lateral edge is not as clearly demarcated. In       effort to prevent potential difficulty with another Endoscopic approach       at resection, I did not attempt lifting.      There was evidence of a patent Endoscopic Ampullectomy. This was       characterized by healthy appearing mucosa. Impression:               - No gross lesions in esophagus.                           - Innumberable gastric polyps. Resection not                            attempted.                           - Duodenal polyposis.                           - A single duodenal polyp, at least 35 mm in size                            extending 50-60% of the wall, noticed on                            side-viewing endoscopy.                           - Patent Endoscopic Ampullectomy, characterized by                            healthy appearing mucosa was found. Recommendation:           - The patient will be observed post-procedure,  until all discharge criteria are met.                           - Discharge patient to home.                           - Patient has a contact number available for                            emergencies. The signs and symptoms of potential                            delayed complications were discussed with the                            patient. Return to normal activities tomorrow.                            Written discharge instructions were provided to the                            patient.                           - Resume previous diet.                           - Will  discuss case further with Dr. Carlean Purl and                            with Dr. Harl Bowie about the extensiveness of the                            duodenal polyp. Unable to perform Endorotor today,                            but may be available in future here or if                            discussion with Dr. Harl Bowie were that he would like                            to attempt resection we could arranged this as well.                           - The findings and recommendations were discussed                            with the patient.                           - The findings and recommendations were discussed                            with  the patient's family. Procedure Code(s):        --- Professional ---                           [phone], Esophagogastroduodenoscopy, flexible,                            transoral; diagnostic, including collection of                            specimen(s) by brushing or washing, when performed                            (separate procedure) Diagnosis Code(s):        --- Professional ---                           K31.7, Polyp of stomach and duodenum                           Z98.890, Other specified postprocedural states CPT copyright 2018 [employer] Association. All rights reserved. The codes documented in this report are preliminary and upon coder review may  be revised to meet current compliance requirements. Justice Britain, MD 09/17/2018 8:40:34 AM Number of Addenda: 0

## 2018-09-17 NOTE — Discharge Instructions (Signed)
YOU HAD AN ENDOSCOPIC PROCEDURE TODAY: Refer to the procedure report and other information in the discharge instructions given to you for any specific questions about what was found during the examination. If this information does not answer your questions, please call [place] office at [phone] to clarify.  ° °YOU SHOULD EXPECT: Some feelings of bloating in the abdomen. Passage of more gas than usual. Walking can help get rid of the air that was put into your GI tract during the procedure and reduce the bloating. If you had a lower endoscopy (such as a colonoscopy or flexible sigmoidoscopy) you may notice spotting of blood in your stool or on the toilet paper. Some abdominal soreness may be present for a day or two, also. ° °DIET: Your first meal following the procedure should be a light meal and then it is ok to progress to your normal diet. A half-sandwich or bowl of soup is an example of a good first meal. Heavy or fried foods are harder to digest and may make you feel nauseous or bloated. Drink plenty of fluids but you should avoid alcoholic beverages for 24 hours. If you had a esophageal dilation, please see attached instructions for diet.   ° °ACTIVITY: Your care partner should take you home directly after the procedure. You should plan to take it easy, moving slowly for the rest of the day. You can resume normal activity the day after the procedure however YOU SHOULD NOT DRIVE, use power tools, machinery or perform tasks that involve climbing or major physical exertion for 24 hours (because of the sedation medicines used during the test).  ° °SYMPTOMS TO REPORT IMMEDIATELY: °A gastroenterologist can be reached at any hour. Please call [phone]  for any of the following symptoms:  °Following lower endoscopy (colonoscopy, flexible sigmoidoscopy) °Excessive amounts of blood in the stool  °Significant tenderness, worsening of abdominal pains  °Swelling of the abdomen that is new, acute  °Fever of 100° or  higher  °Following upper endoscopy (EGD, EUS, ERCP, esophageal dilation) °Vomiting of blood or coffee ground material  °New, significant abdominal pain  °New, significant chest pain or pain under the shoulder blades  °Painful or persistently difficult swallowing  °New shortness of breath  °Black, tarry-looking or red, bloody stools ° °FOLLOW UP:  °If any biopsies were taken you will be contacted by phone or by letter within the next 1-3 weeks. Call [phone]  if you have not heard about the biopsies in 3 weeks.  °Please also call with any specific questions about appointments or follow up tests. ° °

## 2018-09-19 ENCOUNTER — Encounter (HOSPITAL_COMMUNITY): Payer: Self-pay | Admitting: Gastroenterology

## 2018-10-04 ENCOUNTER — Other Ambulatory Visit: Payer: Self-pay | Admitting: Internal Medicine

## 2018-10-04 NOTE — Telephone Encounter (Signed)
Please advise if to continue, she was scoped by Dr Rush Landmark on 09/17/2018 .

## 2018-10-04 NOTE — Telephone Encounter (Signed)
Refill x 1 year same sig

## 2018-10-08 ENCOUNTER — Telehealth: Payer: Self-pay | Admitting: Gastroenterology

## 2018-10-08 NOTE — Telephone Encounter (Signed)
Pt had surgery 09/17/18 by Dr. Rush Landmark.  Per pt: the scope wasn't working and Dr. Rush Landmark wanted to have a conference call with Dr. Carlean Purl and another doctor at [facility]. Pt would like to know the results.

## 2018-10-08 NOTE — Telephone Encounter (Signed)
Dr Rush Landmark the pt would like to a call discuss.  See below from procedure report   "Will discuss case further with Dr. Carlean Purl and with Dr. Harl Bowie about the extensiveness of the duodenal polyp. Unable to perform Endorotor today, but may be available in future here or if discussion with Dr. Harl Bowie were that he would like to attempt resection we could arranged this as well."

## 2018-10-09 NOTE — Telephone Encounter (Signed)
My Chart message sent to the pt that Dr Rush Landmark will call her today

## 2018-10-09 NOTE — Telephone Encounter (Signed)
I will try and reach out to patient today (12/24). Thanks. GM

## 2018-10-11 ENCOUNTER — Telehealth: Payer: Self-pay | Admitting: Gastroenterology

## 2018-10-11 NOTE — Telephone Encounter (Signed)
I called and spoke with patient and daughter on 12/24. Discussed the increased risk attempt at endoscopic resection. Discussed having attempt here with me or at [facility] where she has previously had endoscopic papillectomy performed. I suspect they will want to have attempt at [facility] but they are going to think about it over the next few weeks and let us know. We can proceed with referral back to [facility] as necessary. Patty in the  new year let us try and reach out and see if she has made a decision if she doesn't call us back before the 2nd or 3rd of January. Thanks. GM

## 2018-10-15 NOTE — Telephone Encounter (Signed)
See alternate phone note  

## 2018-10-23 NOTE — Telephone Encounter (Signed)
Patty, Please let the patient know that after I have spoken with Dr. Harl Bowie about this particular polyp in the region we both felt that an attempt at endoscopic therapy would be reasonable before referral to surgery. An MRI or CT scan would not necessarily give [place] any more information and it is not until we are actually at the time of trying to remove the polyp that we would be able to guide whether surgery may still be required. Happy to discuss further if questions still remain. Remember, that at the end of the day, whenever you decide and what ever you decide we will support you and arranging procedure at [facility] will not be an issue since you know Dr. Harl Bowie from the past. Thanks. GM

## 2018-10-30 ENCOUNTER — Telehealth: Payer: Self-pay

## 2018-10-30 ENCOUNTER — Other Ambulatory Visit: Payer: Self-pay

## 2018-10-30 DIAGNOSIS — D126 Benign neoplasm of colon, unspecified: Secondary | ICD-10-CM

## 2018-10-30 NOTE — Telephone Encounter (Signed)
EGD/EMR scheduled, pt instructed and medications reviewed.  Patient instructions sent via My Chart.  Patient to call with any questions or concerns.

## 2018-10-30 NOTE — Telephone Encounter (Signed)
Message -----  From: Timothy Lasso, RN  Sent: 10/29/2018  8:38 AM EST  To: Irving Copas., MD  Subject: Visit Follow-Up Question               ----- Message from Timothy Lasso, RN sent at 10/29/2018 8:38 AM EST -----      ----- Message from [name] to Mansouraty, Telford Nab., MD sent at 10/27/2018 4:50 AM -----   Dr Rush Landmark,   Thank you for responding to my question regarding if an MRI would possibly show things   any better.  I don't see any reason t go to [facility] when I'm talking to a Dr that was trained there.  So if you would have your appt. ladies to go ahead and put me on your calendar to get this polyp out.  I'll be looking for the information of when and which hospital.    Thank you,   [name]      Emma Lutz  10/27/2018  Patient Message  MRN:  [id number]  Description: 63 year old female Provider: Mychart, Generic Provider Department: [facility]     Call Documentation   Mansouraty, Telford Nab., MD at 10/30/2018 6:22 AM   Status: Signed    Emma Lutz, please see patient reply and note. Please schedule for EGD with EMR - 2 hour slot. Once you have scheduled, let me know. Glendell Docker - FYI. Thanks. GM

## 2018-10-30 NOTE — Telephone Encounter (Signed)
Patty, please see patient reply and note. Please schedule for EGD with EMR - 2 hour slot. Once you have scheduled, let me know. Glendell Docker - FYI. Thanks. GM

## 2018-11-23 NOTE — Progress Notes (Addendum)
I was unable to reach patient by phone.  I left  A message on voice mail.  I instructed the patient to arrive at [place] entrance at 9:45 AM , register in the [place]. DO NOT eat or drink anything after midnight.  I instructed the patient to take the following medications in the am with just enough water to get them down:Prevacid, Paxil, Clartin.  Follow GI MD on holding Celebrex or not.   I asked patient to not wear any lotions, powders, cologne, jewelry, piercing, make-up or nail polish.  Wear clean clothes. Brush teeth. I informed patient that there will need to be a driver and someone to stay with him/her for the first 24 hours after surgery.   I instructed  patient to call [phone], in the am if there were any questions or problems.

## 2018-11-26 ENCOUNTER — Ambulatory Visit (HOSPITAL_COMMUNITY): Payer: BLUE CROSS/BLUE SHIELD | Admitting: Anesthesiology

## 2018-11-26 ENCOUNTER — Ambulatory Visit (HOSPITAL_COMMUNITY)
Admission: RE | Admit: 2018-11-26 | Discharge: 2018-11-26 | Disposition: A | Discharge status: Home / Self Care | Payer: BLUE CROSS/BLUE SHIELD | Source: Ambulatory Visit | Attending: Gastroenterology | Admitting: Gastroenterology

## 2018-11-26 ENCOUNTER — Other Ambulatory Visit: Payer: Self-pay

## 2018-11-26 ENCOUNTER — Encounter (HOSPITAL_COMMUNITY)
Admission: RE | Disposition: A | Discharge status: Home / Self Care | Payer: Self-pay | Source: Ambulatory Visit | Attending: Gastroenterology

## 2018-11-26 ENCOUNTER — Encounter (HOSPITAL_COMMUNITY): Payer: Self-pay | Admitting: Anesthesiology

## 2018-11-26 DIAGNOSIS — Z808 Family history of malignant neoplasm of other organs or systems: Secondary | ICD-10-CM | POA: Diagnosis not present

## 2018-11-26 DIAGNOSIS — Z86711 Personal history of pulmonary embolism: Secondary | ICD-10-CM | POA: Insufficient documentation

## 2018-11-26 DIAGNOSIS — K317 Polyp of stomach and duodenum: Secondary | ICD-10-CM | POA: Diagnosis not present

## 2018-11-26 DIAGNOSIS — Z86010 Personal history of colonic polyps: Secondary | ICD-10-CM | POA: Diagnosis not present

## 2018-11-26 DIAGNOSIS — M1990 Unspecified osteoarthritis, unspecified site: Secondary | ICD-10-CM | POA: Diagnosis not present

## 2018-11-26 DIAGNOSIS — D132 Benign neoplasm of duodenum: Secondary | ICD-10-CM | POA: Insufficient documentation

## 2018-11-26 DIAGNOSIS — R7303 Prediabetes: Secondary | ICD-10-CM | POA: Insufficient documentation

## 2018-11-26 DIAGNOSIS — K922 Gastrointestinal hemorrhage, unspecified: Secondary | ICD-10-CM

## 2018-11-26 DIAGNOSIS — Z6837 Body mass index (BMI) 37.0-37.9, adult: Secondary | ICD-10-CM | POA: Insufficient documentation

## 2018-11-26 DIAGNOSIS — E669 Obesity, unspecified: Secondary | ICD-10-CM | POA: Diagnosis not present

## 2018-11-26 DIAGNOSIS — Z882 Allergy status to sulfonamides status: Secondary | ICD-10-CM | POA: Insufficient documentation

## 2018-11-26 DIAGNOSIS — Z8371 Family history of colonic polyps: Secondary | ICD-10-CM | POA: Diagnosis not present

## 2018-11-26 DIAGNOSIS — Z800 Family history of malignant neoplasm of digestive organs: Secondary | ICD-10-CM | POA: Diagnosis not present

## 2018-11-26 DIAGNOSIS — D126 Benign neoplasm of colon, unspecified: Secondary | ICD-10-CM

## 2018-11-26 DIAGNOSIS — Z841 Family history of disorders of kidney and ureter: Secondary | ICD-10-CM | POA: Insufficient documentation

## 2018-11-26 DIAGNOSIS — Z9049 Acquired absence of other specified parts of digestive tract: Secondary | ICD-10-CM | POA: Insufficient documentation

## 2018-11-26 DIAGNOSIS — K219 Gastro-esophageal reflux disease without esophagitis: Secondary | ICD-10-CM | POA: Diagnosis not present

## 2018-11-26 DIAGNOSIS — K319 Disease of stomach and duodenum, unspecified: Secondary | ICD-10-CM

## 2018-11-26 DIAGNOSIS — D131 Benign neoplasm of stomach: Secondary | ICD-10-CM | POA: Diagnosis not present

## 2018-11-26 DIAGNOSIS — Z833 Family history of diabetes mellitus: Secondary | ICD-10-CM | POA: Insufficient documentation

## 2018-11-26 DIAGNOSIS — Z8249 Family history of ischemic heart disease and other diseases of the circulatory system: Secondary | ICD-10-CM | POA: Diagnosis not present

## 2018-11-26 DIAGNOSIS — Z823 Family history of stroke: Secondary | ICD-10-CM | POA: Insufficient documentation

## 2018-11-26 HISTORY — PX: POLYPECTOMY: SHX5525

## 2018-11-26 HISTORY — PX: BIOPSY: SHX5522

## 2018-11-26 HISTORY — PX: ESOPHAGOGASTRODUODENOSCOPY (EGD) WITH PROPOFOL: SHX5813

## 2018-11-26 LAB — CBC
HCT: 36.4 % (ref 36.0–46.0)
Hemoglobin: 10.4 g/dL — ABNORMAL LOW (ref 12.0–15.0)
MCH: 21.3 pg — ABNORMAL LOW (ref 26.0–34.0)
MCHC: 28.6 g/dL — ABNORMAL LOW (ref 30.0–36.0)
MCV: 74.4 fL — ABNORMAL LOW (ref 80.0–100.0)
Platelets: 215 10*3/uL (ref 150–400)
RBC: 4.89 MIL/uL (ref 3.87–5.11)
RDW: 18.5 % — AB (ref 11.5–15.5)
WBC: 10.5 10*3/uL (ref 4.0–10.5)
nRBC: 0 % (ref 0.0–0.2)

## 2018-11-26 LAB — FERRITIN: Ferritin: 4 ng/mL — ABNORMAL LOW (ref 11–307)

## 2018-11-26 LAB — IRON AND TIBC
Iron: 31 ug/dL (ref 28–170)
SATURATION RATIOS: 7 % — AB (ref 10.4–31.8)
TIBC: 437 ug/dL (ref 250–450)
UIBC: 406 ug/dL

## 2018-11-26 SURGERY — ESOPHAGOGASTRODUODENOSCOPY (EGD) WITH PROPOFOL
Anesthesia: Monitor Anesthesia Care

## 2018-11-26 MED ORDER — LIDOCAINE 2% (20 MG/ML) 5 ML SYRINGE
INTRAMUSCULAR | Status: DC | PRN
  Administered 2018-11-26: 100 mg via INTRAVENOUS

## 2018-11-26 MED ORDER — CELECOXIB 400 MG PO CAPS: 400.0000 mg | ORAL_CAPSULE | Freq: Two times a day (BID) | ORAL | 11 refills | Status: DC

## 2018-11-26 MED ORDER — SCOPOLAMINE 1 MG/3DAYS TD PT72
MEDICATED_PATCH | TRANSDERMAL | Status: AC
  Filled 2018-11-26: qty 1

## 2018-11-26 MED ORDER — GLUCAGON HCL RDNA (DIAGNOSTIC) 1 MG IJ SOLR
INTRAMUSCULAR | Status: AC
  Filled 2018-11-26: qty 2

## 2018-11-26 MED ORDER — DEXAMETHASONE SODIUM PHOSPHATE 10 MG/ML IJ SOLN
INTRAMUSCULAR | Status: DC | PRN
  Administered 2018-11-26: 10 mg via INTRAVENOUS

## 2018-11-26 MED ORDER — SODIUM CHLORIDE 0.9 % IV SOLN: INTRAVENOUS | Status: DC

## 2018-11-26 MED ORDER — LANSOPRAZOLE 30 MG PO CPDR: 30.0000 mg | DELAYED_RELEASE_CAPSULE | Freq: Two times a day (BID) | ORAL | 5 refills | Status: DC

## 2018-11-26 MED ORDER — BUTAMBEN-TETRACAINE-BENZOCAINE 2-2-14 % EX AERO
INHALATION_SPRAY | CUTANEOUS | Status: DC | PRN
  Administered 2018-11-26: 2 via TOPICAL

## 2018-11-26 MED ORDER — PROMETHAZINE HCL 25 MG/ML IJ SOLN: 6.2500 mg | INTRAMUSCULAR | Status: DC | PRN

## 2018-11-26 MED ORDER — SODIUM CHLORIDE (PF) 0.9 % IJ SOLN
PREFILLED_SYRINGE | INTRAMUSCULAR | Status: DC | PRN
  Administered 2018-11-26: 5 mL

## 2018-11-26 MED ORDER — PROPOFOL 500 MG/50ML IV EMUL
INTRAVENOUS | Status: DC | PRN
  Administered 2018-11-26: 13:00:00 via INTRAVENOUS
  Administered 2018-11-26: 100 ug/kg/min via INTRAVENOUS

## 2018-11-26 MED ORDER — FENTANYL CITRATE (PF) 100 MCG/2ML IJ SOLN: 25.0000 ug | INTRAMUSCULAR | Status: DC | PRN

## 2018-11-26 MED ORDER — LACTATED RINGERS IV SOLN
INTRAVENOUS | Status: DC
  Administered 2018-11-26 (×2): via INTRAVENOUS

## 2018-11-26 MED ORDER — SCOPOLAMINE 1 MG/3DAYS TD PT72
1.0000 | MEDICATED_PATCH | TRANSDERMAL | Status: DC
  Administered 2018-11-26: 1.5 mg via TRANSDERMAL

## 2018-11-26 MED ORDER — GLUCAGON HCL RDNA (DIAGNOSTIC) 1 MG IJ SOLR
INTRAMUSCULAR | Status: DC | PRN
  Administered 2018-11-26 (×6): 0.25 mg via INTRAVENOUS

## 2018-11-26 MED ORDER — PHENYLEPHRINE 40 MCG/ML (10ML) SYRINGE FOR IV PUSH (FOR BLOOD PRESSURE SUPPORT)
PREFILLED_SYRINGE | INTRAVENOUS | Status: DC | PRN
  Administered 2018-11-26 (×2): 80 ug via INTRAVENOUS
  Administered 2018-11-26: 40 ug via INTRAVENOUS
  Administered 2018-11-26: 80 ug via INTRAVENOUS
  Administered 2018-11-26: 120 ug via INTRAVENOUS

## 2018-11-26 MED ORDER — PROPOFOL 10 MG/ML IV BOLUS
INTRAVENOUS | Status: DC | PRN
  Administered 2018-11-26: 30 mg via INTRAVENOUS
  Administered 2018-11-26 (×4): 20 mg via INTRAVENOUS

## 2018-11-26 SURGICAL SUPPLY — 14 items

## 2018-11-26 NOTE — Anesthesia Postprocedure Evaluation (Signed)
Anesthesia Post Note  Patient: Emma Lutz  Procedure(s) Performed: ESOPHAGOGASTRODUODENOSCOPY (EGD) WITH PROPOFOL (N/A ) ENDOSCOPIC MUCOSAL RESECTION (N/A ) SUBMUCOSAL LIFTING INJECTION HEMOSTASIS CLIP PLACEMENT HEMOSTASIS CONTROL POLYPECTOMY FOREIGN BODY REMOVAL     Patient location during evaluation: PACU Anesthesia Type: MAC Level of consciousness: awake and alert Pain management: pain level controlled Vital Signs Assessment: post-procedure vital signs reviewed and stable Respiratory status: spontaneous breathing and respiratory function stable Cardiovascular status: stable Postop Assessment: no apparent nausea or vomiting Anesthetic complications: no    Last Vitals:  Vitals:   11/26/18 1405 11/26/18 1415  BP: (!) 114/57 (!) 116/54  Pulse: 85 82  Resp: (!) 22 19  Temp:    SpO2: 94% 96%    Last Pain:  Vitals:   11/26/18 1415  TempSrc:   PainSc: 0-No pain                 Takyla Kuchera DANIEL

## 2018-11-26 NOTE — Transfer of Care (Signed)
Immediate Anesthesia Transfer of Care Note  Patient: Emma Lutz  Procedure(s) Performed: ESOPHAGOGASTRODUODENOSCOPY (EGD) WITH PROPOFOL (N/A ) ENDOSCOPIC MUCOSAL RESECTION (N/A ) SUBMUCOSAL LIFTING INJECTION HEMOSTASIS CLIP PLACEMENT HEMOSTASIS CONTROL POLYPECTOMY FOREIGN BODY REMOVAL  Patient Location: Endoscopy Unit  Anesthesia Type:MAC  Level of Consciousness: sedated and patient cooperative  Airway & Oxygen Therapy: Patient Spontanous Breathing and Patient connected to nasal cannula oxygen  Post-op Assessment: Report given to RN and Post -op Vital signs reviewed and stable  Post vital signs: Reviewed  Last Vitals:  Vitals Value Taken Time  BP 112/58 11/26/2018  1:58 PM  Temp 36.7 C 11/26/2018  1:58 PM  Pulse 87 11/26/2018  2:02 PM  Resp 25 11/26/2018  2:02 PM  SpO2 94 % 11/26/2018  2:02 PM  Vitals shown include unvalidated device data.  Last Pain:  Vitals:   11/26/18 1358  TempSrc: Oral  PainSc: 2          Complications: No apparent anesthesia complications

## 2018-11-26 NOTE — Anesthesia Preprocedure Evaluation (Addendum)
Anesthesia Evaluation  Patient identified by MRN, date of birth, ID band Patient awake    Reviewed: Allergy & Precautions, NPO status , Patient's Chart, lab work & pertinent test results  History of Anesthesia Complications (+) PONV and history of anesthetic complications  Airway Mallampati: II  TM Distance: >3 FB Neck ROM: Full    Dental  (+) Partial Upper, Dental Advisory Given, Implants   Pulmonary neg pulmonary ROS,    breath sounds clear to auscultation       Cardiovascular negative cardio ROS   Rhythm:Regular     Neuro/Psych negative neurological ROS  negative psych ROS   GI/Hepatic Neg liver ROS, GERD  Medicated and Controlled,  Endo/Other  negative endocrine ROS  Renal/GU negative Renal ROS     Musculoskeletal  (+) Arthritis ,   Abdominal (+) + obese,   Peds  Hematology negative hematology ROS (+)   Anesthesia Other Findings   Reproductive/Obstetrics                            Anesthesia Physical  Anesthesia Plan  ASA: II  Anesthesia Plan: MAC   Post-op Pain Management:    Induction: Intravenous  PONV Risk Score and Plan: 3 and Treatment may vary due to age or medical condition, Propofol infusion, Ondansetron and Dexamethasone  Airway Management Planned: Natural Airway and Simple Face Mask  Additional Equipment: None  Intra-op Plan:   Post-operative Plan:   Informed Consent: I have reviewed the patients History and Physical, chart, labs and discussed the procedure including the risks, benefits and alternatives for the proposed anesthesia with the patient or authorized representative who has indicated his/her understanding and acceptance.     Dental advisory given  Plan Discussed with: CRNA and Anesthesiologist  Anesthesia Plan Comments:        Anesthesia Quick Evaluation

## 2018-11-26 NOTE — Op Note (Signed)
[facility] Patient Name: Emma Lutz Procedure Date : 11/26/2018 MRN: [id number] Attending MD: Justice Britain , MD Date of Birth: 18-Nov-1955 CSN: [id number] Age: 63 Admit Type: Outpatient Procedure:                Upper GI endoscopy Indications:              Endoscopic removal of selected lesion in the                            duodenum, Surveillance for malignancy due to                            personal history of Familial Adenomatous Polyposis Providers:                Justice Britain, MD, Carlyn Reichert, RN, Burtis Junes, RN, Cletis Athens, Technician, Elspeth Cho                            Tech., Technician, Luciana Axe, CRNA Referring MD:             Gatha Mayer, MD, Lucilla Edin [place] Medicines:                Monitored Anesthesia Care Complications:            No immediate complications. Estimated Blood Loss:     Estimated blood loss was minimal. Procedure:                Pre-Anesthesia Assessment:                           - Prior to the procedure, a History and Physical                            was performed, and patient medications and                            allergies were reviewed. The patient's tolerance of                            previous anesthesia was also reviewed. The risks                            and benefits of the procedure and the sedation                            options and risks were discussed with the patient.                            All questions were answered, and informed consent                            was obtained. Prior Anticoagulants: The patient has  taken Celebrex (celecoxib). ASA Grade Assessment:                            III - A patient with severe systemic disease. After                            reviewing the risks and benefits, the patient was                            deemed in satisfactory condition to undergo the          procedure.                           After obtaining informed consent, the endoscope was                            passed under direct vision. Throughout the                            procedure, the patient's blood pressure, pulse, and                            oxygen saturations were monitored continuously. The                            GIF-H190 ([id number]) Olympus gastroscope was                            introduced through the mouth, and advanced to the                            third part of duodenum. The TJF-Q180V ([id number])                            Olympus duodenoscope was introduced through the                            mouth, and advanced to the second part of duodenum.                            The PCF-H190DL ([id number]) Olympus pediatric                            colonoscope was introduced through the mouth, and                            advanced to the fourth part of duodenum. The upper                            GI endoscopy was technically difficult and complex.                            The patient tolerated the procedure. Scope In: Scope Out: Findings:  No gross lesions were noted in the entire esophagus.      Red blood was found in the gastric fundus and in the gastric body.       Lavage did not allow complete clearance of the area, but localization of       oozing noted in the fundus.      Localized moderate mucosal changes characterized by hemorrhagic       appearance were found in the gastric fundus. These looks to be a carpet       of polyps within the fundus that was oozing blood as noted above. No       specific polyps was bleeding. Area was successfully injected with of a       1:10,000 solution of epinephrine for hemostasis as well as sprayed on       the mucosa. Biopsies were taken with a cold forceps for histology       purposes - not clearly a mass-lesion but will need close       monitoring/follow up.      Innumerable 1 to 15 mm  pedunculated and sessile polyps were found in the       cardia, in the gastric fundus, in the gastric body and at the incisura.      Two 6 to 10 mm pedunculated and sessile polyps with bleeding and       stigmata of recent bleeding were found in the gastric body. These polyps       were removed with a hot snare. Resection and retrieval were complete.      Multiple sessile polyps were found in the duodenal bulb, in the first       portion of the duodenum, in the second portion of the duodenum and in       the third portion of the duodenum.      A single 35 mm sessile polyp with no bleeding was found just distal to       the angle of the D1/D2 region and proximal to the previous ampullectomy       site. This had previously been biopsied and returned as TVA.       Preparations were made for mucosal resection. [place] Orise Gel was       injected to raise the lesion. Piecemeal mucosal resection using a snare       was performed. Resection and retrieval were incomplete (the margins on       the left lateral and superior portion of the lesion was not able to be       approached today). Coagulation for hemostasis using snare tip to the       margin/edge of the resected area was successful. To close the defect       after mucosal resection, five hemostatic clips were successfully placed       (MR conditional). It was felt this was the maximum closure that could be       performed due to the particular angle of the polyp and resection margin.       I had to place the clips in a over-torched left position to place the       last clip on the superior portion of the resection site. There was no       bleeding during, or at the end, of the procedure. Impression:               - No gross lesions in  esophagus.                           - Red blood in the gastric fundus and in the                            gastric body.                           - Hemorrhagic appearing mucosa in the gastric                             fundus - carpet of polyps. Injected with Epi.                            Biopsied.                           - Innumerable gastric polyps.                           - Two gastric polyps with evidence of recent                            bleeding were resected and retrieved.                           - Duodenal polyposis.                           - A single duodenal polyp. Resected and retrieved.                            Treated with a hot snare tip. Clips (MR                            conditional) were placed to close defect. Superior                            and left lateral wall will need to be worked on in                            the near future. Recommendation:           - The patient will be observed post-procedure,                            until all discharge criteria are met.                           - Discharge patient to home.                           - Patient has a contact number available for  emergencies. The signs and symptoms of potential                            delayed complications were discussed with the                            patient. Return to normal activities tomorrow.                            Written discharge instructions were provided to the                            patient.                           - Full liquid diet today.                           - Hold Celcoxib for 7-10 days post-mucosectomy to                            decrease risk of bleeding.                           - Observe patient's clinical course.                           - Obtain CBC/Iron/TIBC/Ferritin today to evaluate                            if she has been oozing periodically from her                            stomach such that she will require Iron                            supplementation to optimize her.                           - Await pathology results.                           - Increase PPI to BID dosing.                            - Repeat upper endoscopy in 4-6 months for                            surveillance and to proceed with likely Endorotor                            for any recurrent polyp that is found.                           - The findings and recommendations were discussed  with the patient.                           - The findings and recommendations were discussed                            with the patient's family. Procedure Code(s):        --- Professional ---                           [phone], Esophagogastroduodenoscopy, flexible,                            transoral; with endoscopic mucosal resection Diagnosis Code(s):        --- Professional ---                           K92.2, Gastrointestinal hemorrhage, unspecified                           K31.7, Polyp of stomach and duodenum                           K31.9, Disease of stomach and duodenum, unspecified                           Z86.010, Personal history of colonic polyps CPT copyright 2018 American Medical Association. All rights reserved. The codes documented in this report are preliminary and upon coder review may  be revised to meet current compliance requirements. Justice Britain, MD 11/26/2018 2:25:26 PM Number of Addenda: 0

## 2018-11-26 NOTE — Discharge Instructions (Signed)
YOU HAD AN ENDOSCOPIC PROCEDURE TODAY: Refer to the procedure report and other information in the discharge instructions given to you for any specific questions about what was found during the examination. If this information does not answer your questions, please call [facility] office at [phone] to clarify.  ° °YOU SHOULD EXPECT: Some feelings of bloating in the abdomen. Passage of more gas than usual. Walking can help get rid of the air that was put into your GI tract during the procedure and reduce the bloating. If you had a lower endoscopy (such as a colonoscopy or flexible sigmoidoscopy) you may notice spotting of blood in your stool or on the toilet paper. Some abdominal soreness may be present for a day or two, also. ° °DIET: Your first meal following the procedure should be a light meal and then it is ok to progress to your normal diet. A half-sandwich or bowl of soup is an example of a good first meal. Heavy or fried foods are harder to digest and may make you feel nauseous or bloated. Drink plenty of fluids but you should avoid alcoholic beverages for 24 hours. If you had a esophageal dilation, please see attached instructions for diet.   ° °ACTIVITY: Your care partner should take you home directly after the procedure. You should plan to take it easy, moving slowly for the rest of the day. You can resume normal activity the day after the procedure however YOU SHOULD NOT DRIVE, use power tools, machinery or perform tasks that involve climbing or major physical exertion for 24 hours (because of the sedation medicines used during the test).  ° °SYMPTOMS TO REPORT IMMEDIATELY: °A gastroenterologist can be reached at any hour. Please call [phone]  for any of the following symptoms:  °Following lower endoscopy (colonoscopy, flexible sigmoidoscopy) °Excessive amounts of blood in the stool  °Significant tenderness, worsening of abdominal pains  °Swelling of the abdomen that is new, acute  °Fever of 100° or  higher  °Following upper endoscopy (EGD, EUS, ERCP, esophageal dilation) °Vomiting of blood or coffee ground material  °New, significant abdominal pain  °New, significant chest pain or pain under the shoulder blades  °Painful or persistently difficult swallowing  °New shortness of breath  °Black, tarry-looking or red, bloody stools ° °FOLLOW UP:  °If any biopsies were taken you will be contacted by phone or by letter within the next 1-3 weeks. Call [phone]  if you have not heard about the biopsies in 3 weeks.  °Please also call with any specific questions about appointments or follow up tests. ° °

## 2018-11-26 NOTE — Anesthesia Procedure Notes (Signed)
Procedure Name: MAC Date/Time: 11/26/2018 11:40 AM Performed by: Jenne Campus, CRNA Pre-anesthesia Checklist: Patient identified, Emergency Drugs available, Suction available and Patient being monitored Oxygen Delivery Method: Nasal cannula

## 2018-11-26 NOTE — H&P (Signed)
GASTROENTEROLOGY OUTPATIENT PROCEDURE H&P NOTE   Primary Care Physician: Sherri Rad, MD  HPI: Emma Lutz is a 63 y.o. female who presents for EGD with Advanced EMR attempt.  Past Medical History:  Diagnosis Date  . Allergy   . Anemia yrs ago   no recent iron use  . Clotting disorder ([place]) 2012   post op blood clots in lungs  . Familial adenomatous polyposis    subtotal colectomy, proctectomy (5/12),  duodenal and gastric adenomas, followed by [facility] ([place])  . GERD (gastroesophageal reflux disease)   . Osteoarthritis   . Pancreatitis 2012  . PONV (postoperative nausea and vomiting) 2012   nausea after last surgery  . Pre-diabetes   . 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 Sessile rectal polyp   . Tubular adenoma    gastric, multiple  . Wears glasses   . Wears partial dentures    Past Surgical History:  Procedure Laterality Date  . APPENDECTOMY    . BIOPSY  06/25/2018   Procedure: BIOPSY;  Surgeon: Gatha Mayer, MD;  Location: [facility];  Service: Endoscopy;;  . CARPAL TUNNEL RELEASE Left   . ELBOW SURGERY Right   . ESOPHAGOGASTRODUODENOSCOPY    . ESOPHAGOGASTRODUODENOSCOPY N/A 10/16/2014   Procedure: ESOPHAGOGASTRODUODENOSCOPY (EGD);  Surgeon: Gatha Mayer, MD;  Location: [facility];  Service: Endoscopy;  Laterality: N/A;  need ercp scope  . ESOPHAGOGASTRODUODENOSCOPY N/A 12/24/2015   Procedure: ESOPHAGOGASTRODUODENOSCOPY (EGD);  Surgeon: Gatha Mayer, MD;  Location: [facility];  Service: Endoscopy;  Laterality: N/A;  May need ERCP scope  . ESOPHAGOGASTRODUODENOSCOPY (EGD) WITH PROPOFOL N/A 01/10/2017   Procedure: ESOPHAGOGASTRODUODENOSCOPY (EGD) WITH PROPOFOL;  Surgeon: Gatha Mayer, MD;  Location: [facility];  Service: Endoscopy;  Laterality: N/A;  . ESOPHAGOGASTRODUODENOSCOPY (EGD) WITH PROPOFOL N/A 06/25/2018   Procedure: ESOPHAGOGASTRODUODENOSCOPY (EGD) WITH PROPOFOL;  Surgeon: Gatha Mayer, MD;  Location: [facility];  Service: Endoscopy;  Laterality: N/A;  . ESOPHAGOGASTRODUODENOSCOPY (EGD) WITH PROPOFOL N/A 09/17/2018   Procedure: ESOPHAGOGASTRODUODENOSCOPY (EGD) WITH PROPOFOL;  Surgeon: Rush Landmark Telford Nab., MD;  Location: [facility];  Service: Gastroenterology;  Laterality: N/A;  . FLEXIBLE SIGMOIDOSCOPY    . FLEXIBLE SIGMOIDOSCOPY N/A 10/16/2014   Procedure: FLEXIBLE SIGMOIDOSCOPY;  Surgeon: Gatha Mayer, MD;  Location: [facility];  Service: Endoscopy;  Laterality: N/A;  . FLEXIBLE SIGMOIDOSCOPY N/A 12/24/2015   Procedure: FLEXIBLE SIGMOIDOSCOPY;  Surgeon: Gatha Mayer, MD;  Location: [facility];  Service: Endoscopy;  Laterality: N/A;  . FLEXIBLE SIGMOIDOSCOPY N/A 01/10/2017   Procedure: FLEXIBLE SIGMOIDOSCOPY;  Surgeon: Gatha Mayer, MD;  Location: [facility];  Service: Endoscopy;  Laterality: N/A;  . FLEXIBLE SIGMOIDOSCOPY N/A 06/25/2018   Procedure: FLEXIBLE SIGMOIDOSCOPY;  Surgeon: Gatha Mayer, MD;  Location: [facility];  Service: Endoscopy;  Laterality: N/A;  . HERNIA REPAIR    . ILEOSTOMY CLOSURE  08/17/11   Dr. Brooke Pace  . LUMBAR SPINE SURGERY     X 2  . MULTIPLE TOOTH EXTRACTIONS    . PROCTECTOMY  02/16/11   with loop ileostomy [place] [place]  . ROTATOR CUFF REPAIR Right 1990  . TOTAL COLECTOMY  1976   with ileostomy/ileo-rectal anastomosis  . TUBAL LIGATION     No current facility-administered medications for this encounter.    Allergies  Allergen Reactions  . Sulfonamide Derivatives Rash   Family History  Problem Relation Age of Onset  . Heart disease Mother   . Emphysema Mother   . Familial polyposis  Father   . Esophageal cancer Father   . Colon cancer Father   . Diabetes Father   . Colon polyps Father   . Kidney disease Father   . Transient ischemic attack Father   . Brain cancer Sister   . Rectal cancer Neg Hx   . Stomach cancer Neg Hx   . Inflammatory bowel disease Neg Hx   . Liver disease Neg Hx   . Pancreatic cancer Neg Hx     Social History   Socioeconomic History  . Marital status: Married    Spouse name: Not on file  . Number of children: 2  . Years of education: Not on file  . Highest education level: Not on file  Occupational History  . Occupation: employed    Fish farm manager: Office manager  . Occupation: Probation officer  Social Needs  . Financial resource strain: Not on file  . Food insecurity:    Worry: Not on file    Inability: Not on file  . Transportation needs:    Medical: Not on file    Non-medical: Not on file  Tobacco Use  . Smoking status: Never Smoker  . Smokeless tobacco: Never Used  Substance and Sexual Activity  . Alcohol use: No    Alcohol/week: 0.0 standard drinks  . Drug use: No  . Sexual activity: Not on file  Lifestyle  . Physical activity:    Days per week: Not on file    Minutes per session: Not on file  . Stress: Not on file  Relationships  . Social connections:    Talks on phone: Not on file    Gets together: Not on file    Attends religious service: Not on file    Active member of club or organization: Not on file    Attends meetings of clubs or organizations: Not on file    Relationship status: Not on file  . Intimate partner violence:    Fear of current or ex partner: Not on file    Emotionally abused: Not on file    Physically abused: Not on file    Forced sexual activity: Not on file  Other Topics Concern  . Not on file  Social History Narrative   The patient is widowed she has 2 daughters and grandchildren that live in [place]   She works as a Hotel manager support person   2-3 caffeinated beverages daily    Physical Exam: Vital signs in last 24 hours: Weight:  [93 kg] 93 kg (02/10 0830)   GEN: NAD EYE: Sclerae anicteric ENT: MMM CV: RR without R/Gs  RESP: CTAB posteriorly GI: Soft, NT/ND NEURO:  Alert & Oriented x 3  Lab Results: No results for input(s): WBC, HGB, HCT, PLT in the last 72 hours. BMET No results for  input(s): NA, K, CL, CO2, GLUCOSE, BUN, CREATININE, CALCIUM in the last 72 hours. LFT No results for input(s): PROT, ALBUMIN, AST, ALT, ALKPHOS, BILITOT, BILIDIR, IBILI in the last 72 hours. PT/INR No results for input(s): LABPROT, INR in the last 72 hours.   Impression / Plan: This is a 63 y.o.female who presents for EGD with Advanced EMR attempt.  The risks and benefits of endoscopic evaluation were discussed with the patient; these include but are not limited to the risk of perforation, infection, bleeding, missed lesions, lack of diagnosis, severe illness requiring hospitalization, as well as anesthesia and sedation related illnesses.  The patient is agreeable to proceed.    [employer],  MD [facility] Advanced Endoscopy Office # [phone]

## 2018-11-27 ENCOUNTER — Encounter (HOSPITAL_COMMUNITY): Payer: Self-pay | Admitting: Gastroenterology

## 2018-11-27 ENCOUNTER — Telehealth: Payer: Self-pay | Admitting: Gastroenterology

## 2018-11-27 DIAGNOSIS — D509 Iron deficiency anemia, unspecified: Secondary | ICD-10-CM

## 2018-11-27 NOTE — Telephone Encounter (Signed)
Patient's labs were reviewed from yesterday.  She has iron deficiency anemia. I called and spoke with the patient about these results. She is not had any significant melena or dark stools. She began eating today and has some mild queasiness but has no significant abdominal pain and is able to tolerate full meals now. I would like the patient to be initiated on oral iron supplementation ferrous gluconate 324 mg daily. I would like the patient to be initiated on Feraheme as well.  I would like her to receive an IV infusion x1 and then 10 to 14 days later to have a second dose of IV Feraheme infusion. I would like the patient to then have a repeat CBC with iron/TIBC/ferritin to be performed approximately 3 weeks after the second infusion. The patient understands and agrees with this plan of action. I have spoken with the patient's primary gastroenterologist as well. Biopsies pending from yesterday. Patient appreciative for call back.  Justice Britain, MD [facility] Advanced Endoscopy Office # [phone]

## 2018-11-28 ENCOUNTER — Other Ambulatory Visit: Payer: Self-pay

## 2018-11-28 DIAGNOSIS — D509 Iron deficiency anemia, unspecified: Secondary | ICD-10-CM

## 2018-11-28 MED ORDER — FERROUS GLUCONATE 324 (38 FE) MG PO TABS: 324.0000 mg | ORAL_TABLET | Freq: Every day | ORAL | 3 refills | Status: DC

## 2018-11-28 NOTE — Addendum Note (Signed)
Addended by: Marlon Pel on: 11/28/2018 10:19 AM   Modules accepted: Orders

## 2018-11-28 NOTE — Telephone Encounter (Signed)
Patient notified of the recommendations and appt details.  She verbalized understanding.

## 2018-11-28 NOTE — Telephone Encounter (Signed)
Patient has been scheduled for Feraheme on 12/04/18 10:00 and 12/14/18 10:00 at the [facility].  New labs entered for post infusion  rx for iron sent to rx Left message for patient to call back

## 2018-11-29 ENCOUNTER — Encounter: Payer: Self-pay | Admitting: Gastroenterology

## 2018-12-04 ENCOUNTER — Ambulatory Visit (HOSPITAL_COMMUNITY)
Admission: RE | Admit: 2018-12-04 | Discharge: 2018-12-04 | Disposition: A | Discharge status: Home / Self Care | Payer: BLUE CROSS/BLUE SHIELD | Source: Ambulatory Visit | Attending: Gastroenterology | Admitting: Gastroenterology

## 2018-12-04 DIAGNOSIS — D509 Iron deficiency anemia, unspecified: Secondary | ICD-10-CM | POA: Diagnosis not present

## 2018-12-04 MED ORDER — SODIUM CHLORIDE 0.9 % IV SOLN
INTRAVENOUS | Status: DC | PRN
  Administered 2018-12-04: 250 mL via INTRAVENOUS

## 2018-12-04 MED ORDER — SODIUM CHLORIDE 0.9 % IV SOLN
510.0000 mg | INTRAVENOUS | Status: DC
  Administered 2018-12-04: 510 mg via INTRAVENOUS
  Filled 2018-12-04: qty 17

## 2018-12-04 NOTE — Progress Notes (Signed)
PATIENT CARE CENTER NOTE  Diagnosis: Iron Deficiency Anemia    Provider: Dr. Silvano Rusk   Procedure: IV Feraheme    Note: Patient received IV Feraheme infusion. Observed patient for 30 minutes post-infusion. No adverse reaction noted. Vital signs stable. Discharge instructions given. Patient alert, oriented and ambulatory at discharge.

## 2018-12-04 NOTE — Discharge Instructions (Signed)

## 2018-12-14 ENCOUNTER — Ambulatory Visit (HOSPITAL_COMMUNITY)
Admission: RE | Admit: 2018-12-14 | Discharge: 2018-12-14 | Disposition: A | Discharge status: Home / Self Care | Payer: BLUE CROSS/BLUE SHIELD | Source: Ambulatory Visit | Attending: Gastroenterology | Admitting: Gastroenterology

## 2018-12-14 DIAGNOSIS — D509 Iron deficiency anemia, unspecified: Secondary | ICD-10-CM | POA: Diagnosis not present

## 2018-12-14 MED ORDER — SODIUM CHLORIDE 0.9 % IV SOLN
INTRAVENOUS | Status: DC | PRN
  Administered 2018-12-14: 250 mL via INTRAVENOUS

## 2018-12-14 MED ORDER — SODIUM CHLORIDE 0.9 % IV SOLN
510.0000 mg | Freq: Once | INTRAVENOUS | Status: AC
  Administered 2018-12-14: 510 mg via INTRAVENOUS
  Filled 2018-12-14: qty 17

## 2018-12-14 NOTE — Progress Notes (Signed)
PATIENT CARE CENTER NOTE   DIAGNOSIS: Anemia   PROVIDER: Sarita Haver, MD   PROCEDURE: IV Feraheme Infusion   NOTE: Pt presented to day infusion hospital for IV feraheme infusion. Pt ambulatory, alert and orientated x4. Vital signs stable. No adverse reactions or complications with infusion. Pt D/C home at this time.

## 2018-12-14 NOTE — Discharge Instructions (Signed)

## 2018-12-14 NOTE — Progress Notes (Signed)
Patient received Feraheme  via PIV as ordered. Observed for at least 30 minutes post infusion.Tolerated well, vitals stable, discharge instructions given, verbalized understanding. Patient alert, oriented and ambulatory at the time of discharge.  

## 2019-01-19 ENCOUNTER — Other Ambulatory Visit: Payer: Self-pay | Admitting: Internal Medicine

## 2019-02-21 ENCOUNTER — Other Ambulatory Visit: Payer: Self-pay | Admitting: Gastroenterology

## 2019-03-30 ENCOUNTER — Other Ambulatory Visit: Payer: Self-pay | Admitting: Gastroenterology

## 2019-04-17 ENCOUNTER — Other Ambulatory Visit: Payer: Self-pay | Admitting: Internal Medicine

## 2019-04-25 DIAGNOSIS — Z01419 Encounter for gynecological examination (general) (routine) without abnormal findings: Secondary | ICD-10-CM | POA: Diagnosis not present

## 2019-04-25 DIAGNOSIS — Z6841 Body Mass Index (BMI) 40.0 and over, adult: Secondary | ICD-10-CM | POA: Diagnosis not present

## 2019-04-25 DIAGNOSIS — Z800 Family history of malignant neoplasm of digestive organs: Secondary | ICD-10-CM | POA: Diagnosis not present

## 2019-04-25 DIAGNOSIS — Z808 Family history of malignant neoplasm of other organs or systems: Secondary | ICD-10-CM | POA: Diagnosis not present

## 2019-04-25 DIAGNOSIS — Z1231 Encounter for screening mammogram for malignant neoplasm of breast: Secondary | ICD-10-CM | POA: Diagnosis not present

## 2019-04-25 DIAGNOSIS — N898 Other specified noninflammatory disorders of vagina: Secondary | ICD-10-CM | POA: Diagnosis not present

## 2019-04-25 DIAGNOSIS — Z86010 Personal history of colonic polyps: Secondary | ICD-10-CM | POA: Diagnosis not present

## 2019-04-29 ENCOUNTER — Other Ambulatory Visit: Payer: Self-pay | Admitting: Obstetrics and Gynecology

## 2019-04-29 DIAGNOSIS — R928 Other abnormal and inconclusive findings on diagnostic imaging of breast: Secondary | ICD-10-CM

## 2019-05-05 ENCOUNTER — Other Ambulatory Visit: Payer: Self-pay | Admitting: Gastroenterology

## 2019-05-21 ENCOUNTER — Other Ambulatory Visit: Payer: Self-pay | Admitting: Internal Medicine

## 2019-05-21 DIAGNOSIS — Z9189 Other specified personal risk factors, not elsewhere classified: Secondary | ICD-10-CM | POA: Diagnosis not present

## 2019-05-21 DIAGNOSIS — Z809 Family history of malignant neoplasm, unspecified: Secondary | ICD-10-CM | POA: Diagnosis not present

## 2019-05-21 NOTE — Telephone Encounter (Signed)
Refill Sir? She had labs in February.

## 2019-05-21 NOTE — Telephone Encounter (Signed)
Refill  Also ask her to come for the ferritin and CBC that is pending (ordered)

## 2019-05-21 NOTE — Telephone Encounter (Signed)
I have spoken to Emma Lutz and told her to come for labs.

## 2019-05-27 ENCOUNTER — Telehealth: Payer: Self-pay

## 2019-05-27 NOTE — Telephone Encounter (Signed)
-----   Message from Irving Copas., MD sent at 05/27/2019  4:05 AM EDT ----- Regarding: RE: Follow up High priority for my August or September dates. Thanks Ky Rumple. GM ----- Message ----- From: Timothy Lasso, RN Sent: 05/27/2019 To: Irving Copas., MD Subject: RE: Follow up                                   ----- Message ----- From: Irving Copas., MD Sent: 05/08/2019   2:24 PM EDT To: Gatha Mayer, MD, Timothy Lasso, RN Subject: Follow up                                      Chong Sicilian, We need to get this patient back in for follow up. She needs an EGD with EMR and Flex Sig 2-hour slot. If August is full then September. Let me know when she wants to schedule. Thanks. GM

## 2019-05-27 NOTE — Telephone Encounter (Signed)
appt for EGD,EMR,FLEX with Dr Jerilynn Mages at [facility] on 9/30 at 36 am

## 2019-05-28 NOTE — Telephone Encounter (Signed)
EGD-FLEX scheduled, pt instructed and medications reviewed.  Patient instructions mailed to home.  Patient to call with any questions or concerns. COVID instructions given and understood

## 2019-05-28 NOTE — Telephone Encounter (Signed)
COVID 9/26

## 2019-06-20 ENCOUNTER — Other Ambulatory Visit: Payer: Self-pay | Admitting: Gastroenterology

## 2019-07-01 ENCOUNTER — Other Ambulatory Visit: Payer: Self-pay | Admitting: Internal Medicine

## 2019-07-01 NOTE — Telephone Encounter (Signed)
How many refills Sir? 

## 2019-07-03 ENCOUNTER — Telehealth: Payer: Self-pay

## 2019-07-03 NOTE — Telephone Encounter (Signed)
-----   Message from Timothy Lasso, RN sent at 05/22/2019  8:56 AM EDT ----- Regarding: FW: next EGD?  ----- Message ----- From: Irving Copas., MD Sent: 05/21/2019   5:35 PM EDT To: Gatha Mayer, MD, Timothy Lasso, RN Subject: RE: next EGD?                                  She is still in list and I sent a recent message to Chong Sicilian in the last few weeks to get her in August or September.   We'll get her soon.  Fingers crossed things haven't progressed to significantly. [place] let's get her on a 2 hour EMR slot in the next 6-weeks. Thanks. GM ----- Message ----- From: Gatha Mayer, MD Sent: 05/21/2019   4:25 PM EDT To: Irving Copas., MD Subject: next EGD?                                      Emma Register,  [name] wanted a ferrous gluconate refill and I see that she has not had f/u EHD on her duodenal polyp.  Looks like it was due in middle of shutdown.  Just an FYI - in case not still on your list.  Thanks  Glendell Docker

## 2019-07-03 NOTE — Telephone Encounter (Signed)
The pt has been scheduled for 9/30 and has been advised and instructed.

## 2019-07-13 ENCOUNTER — Other Ambulatory Visit (HOSPITAL_COMMUNITY)
Admission: RE | Admit: 2019-07-13 | Discharge: 2019-07-13 | Disposition: A | Discharge status: Home / Self Care | Payer: BC Managed Care – PPO | Source: Ambulatory Visit | Attending: Gastroenterology | Admitting: Gastroenterology

## 2019-07-13 DIAGNOSIS — Z01812 Encounter for preprocedural laboratory examination: Secondary | ICD-10-CM | POA: Insufficient documentation

## 2019-07-13 DIAGNOSIS — Z20828 Contact with and (suspected) exposure to other viral communicable diseases: Secondary | ICD-10-CM | POA: Insufficient documentation

## 2019-07-14 LAB — NOVEL CORONAVIRUS, NAA (HOSP ORDER, SEND-OUT TO REF LAB; TAT 18-24 HRS): SARS-CoV-2, NAA: NOT DETECTED

## 2019-07-15 NOTE — Progress Notes (Signed)
Left message for patient to call back about EGD scheduled for 07/17/2019.

## 2019-07-16 ENCOUNTER — Other Ambulatory Visit: Payer: Self-pay

## 2019-07-16 ENCOUNTER — Encounter (HOSPITAL_COMMUNITY): Payer: Self-pay | Admitting: Emergency Medicine

## 2019-07-16 NOTE — Progress Notes (Signed)
Pre-op endo call again attempted. No answer, again lmtcb

## 2019-07-16 NOTE — Progress Notes (Signed)
Pre-op endo call completed.  states she spoke with someone about not being able to check in at given time due to her daughter not being available , says she was told she can check in at 0700

## 2019-07-16 NOTE — Anesthesia Preprocedure Evaluation (Addendum)
Anesthesia Evaluation  Patient identified by MRN, date of birth, ID band Patient awake    Reviewed: Allergy & Precautions, NPO status , Patient's Chart, lab work & pertinent test results  History of Anesthesia Complications (+) PONV and history of anesthetic complications  Airway Mallampati: II  TM Distance: >3 FB Neck ROM: Full    Dental  (+) Partial Upper, Dental Advisory Given   Pulmonary PE   Pulmonary exam normal        Cardiovascular (-) anginanegative cardio ROS Normal cardiovascular exam     Neuro/Psych negative neurological ROS  negative psych ROS   GI/Hepatic Neg liver ROS, GERD  Medicated and Controlled, FAP    Endo/Other   Obesity Pre-DM   Renal/GU negative Renal ROS     Musculoskeletal  (+) Arthritis ,   Abdominal   Peds  Hematology negative hematology ROS (+)   Anesthesia Other Findings   Reproductive/Obstetrics                            Anesthesia Physical Anesthesia Plan  ASA: II  Anesthesia Plan: MAC   Post-op Pain Management:    Induction: Intravenous  PONV Risk Score and Plan: 3 and Propofol infusion and Treatment may vary due to age or medical condition  Airway Management Planned: Nasal Cannula and Natural Airway  Additional Equipment: None  Intra-op Plan:   Post-operative Plan:   Informed Consent: I have reviewed the patients History and Physical, chart, labs and discussed the procedure including the risks, benefits and alternatives for the proposed anesthesia with the patient or authorized representative who has indicated his/her understanding and acceptance.       Plan Discussed with: CRNA and Anesthesiologist  Anesthesia Plan Comments:        Anesthesia Quick Evaluation

## 2019-07-17 ENCOUNTER — Ambulatory Visit (HOSPITAL_COMMUNITY)
Admission: RE | Admit: 2019-07-17 | Discharge: 2019-07-17 | Disposition: A | Discharge status: Home / Self Care | Payer: BC Managed Care – PPO | Attending: Gastroenterology | Admitting: Gastroenterology

## 2019-07-17 ENCOUNTER — Encounter (HOSPITAL_COMMUNITY): Payer: Self-pay | Admitting: *Deleted

## 2019-07-17 ENCOUNTER — Ambulatory Visit (HOSPITAL_COMMUNITY): Payer: BC Managed Care – PPO | Admitting: Anesthesiology

## 2019-07-17 ENCOUNTER — Encounter (HOSPITAL_COMMUNITY)
Admission: RE | Disposition: A | Discharge status: Home / Self Care | Payer: Self-pay | Source: Home / Self Care | Attending: Gastroenterology

## 2019-07-17 DIAGNOSIS — Z882 Allergy status to sulfonamides status: Secondary | ICD-10-CM | POA: Diagnosis not present

## 2019-07-17 DIAGNOSIS — K640 First degree hemorrhoids: Secondary | ICD-10-CM | POA: Diagnosis not present

## 2019-07-17 DIAGNOSIS — M1990 Unspecified osteoarthritis, unspecified site: Secondary | ICD-10-CM | POA: Diagnosis not present

## 2019-07-17 DIAGNOSIS — Z8719 Personal history of other diseases of the digestive system: Secondary | ICD-10-CM | POA: Diagnosis not present

## 2019-07-17 DIAGNOSIS — D126 Benign neoplasm of colon, unspecified: Secondary | ICD-10-CM | POA: Diagnosis not present

## 2019-07-17 DIAGNOSIS — K317 Polyp of stomach and duodenum: Secondary | ICD-10-CM | POA: Diagnosis not present

## 2019-07-17 DIAGNOSIS — X58XXXA Exposure to other specified factors, initial encounter: Secondary | ICD-10-CM | POA: Diagnosis not present

## 2019-07-17 DIAGNOSIS — E669 Obesity, unspecified: Secondary | ICD-10-CM | POA: Insufficient documentation

## 2019-07-17 DIAGNOSIS — Z6836 Body mass index (BMI) 36.0-36.9, adult: Secondary | ICD-10-CM | POA: Insufficient documentation

## 2019-07-17 DIAGNOSIS — D131 Benign neoplasm of stomach: Secondary | ICD-10-CM | POA: Diagnosis not present

## 2019-07-17 DIAGNOSIS — K3189 Other diseases of stomach and duodenum: Secondary | ICD-10-CM | POA: Diagnosis not present

## 2019-07-17 DIAGNOSIS — Z9049 Acquired absence of other specified parts of digestive tract: Secondary | ICD-10-CM | POA: Diagnosis not present

## 2019-07-17 DIAGNOSIS — T183XXA Foreign body in small intestine, initial encounter: Secondary | ICD-10-CM | POA: Diagnosis not present

## 2019-07-17 DIAGNOSIS — K6389 Other specified diseases of intestine: Secondary | ICD-10-CM | POA: Insufficient documentation

## 2019-07-17 DIAGNOSIS — Z86010 Personal history of colonic polyps: Secondary | ICD-10-CM | POA: Insufficient documentation

## 2019-07-17 DIAGNOSIS — Z86711 Personal history of pulmonary embolism: Secondary | ICD-10-CM | POA: Insufficient documentation

## 2019-07-17 DIAGNOSIS — K219 Gastro-esophageal reflux disease without esophagitis: Secondary | ICD-10-CM | POA: Diagnosis not present

## 2019-07-17 DIAGNOSIS — D132 Benign neoplasm of duodenum: Secondary | ICD-10-CM | POA: Diagnosis not present

## 2019-07-17 DIAGNOSIS — R7303 Prediabetes: Secondary | ICD-10-CM | POA: Insufficient documentation

## 2019-07-17 DIAGNOSIS — Z9889 Other specified postprocedural states: Secondary | ICD-10-CM | POA: Insufficient documentation

## 2019-07-17 HISTORY — PX: HEMOSTASIS CLIP PLACEMENT: SHX6857

## 2019-07-17 HISTORY — PX: ENDOSCOPIC MUCOSAL RESECTION: SHX6839

## 2019-07-17 HISTORY — PX: SUBMUCOSAL LIFTING INJECTION: SHX6855

## 2019-07-17 HISTORY — PX: HEMOSTASIS CONTROL: SHX6838

## 2019-07-17 HISTORY — PX: ESOPHAGOGASTRODUODENOSCOPY (EGD) WITH PROPOFOL: SHX5813

## 2019-07-17 HISTORY — PX: FLEXIBLE SIGMOIDOSCOPY: SHX5431

## 2019-07-17 HISTORY — PX: FOREIGN BODY REMOVAL: SHX962

## 2019-07-17 HISTORY — PX: POLYPECTOMY: SHX5525

## 2019-07-17 SURGERY — ESOPHAGOGASTRODUODENOSCOPY (EGD) WITH PROPOFOL
Anesthesia: Monitor Anesthesia Care

## 2019-07-17 MED ORDER — PROPOFOL 10 MG/ML IV BOLUS
INTRAVENOUS | Status: AC
  Filled 2019-07-17: qty 40

## 2019-07-17 MED ORDER — EPHEDRINE SULFATE-NACL 50-0.9 MG/10ML-% IV SOSY
PREFILLED_SYRINGE | INTRAVENOUS | Status: DC | PRN
  Administered 2019-07-17: 10 mg via INTRAVENOUS

## 2019-07-17 MED ORDER — LANSOPRAZOLE 30 MG PO CPDR: 30.0000 mg | DELAYED_RELEASE_CAPSULE | Freq: Every day | ORAL | 1 refills | Status: DC

## 2019-07-17 MED ORDER — SODIUM CHLORIDE (PF) 0.9 % IJ SOLN
PREFILLED_SYRINGE | INTRAMUSCULAR | Status: DC | PRN
  Administered 2019-07-17: 2 mL

## 2019-07-17 MED ORDER — EPINEPHRINE 1 MG/10ML IJ SOSY
PREFILLED_SYRINGE | INTRAMUSCULAR | Status: AC
  Filled 2019-07-17: qty 10

## 2019-07-17 MED ORDER — PROPOFOL 10 MG/ML IV BOLUS
INTRAVENOUS | Status: DC | PRN
  Administered 2019-07-17: 30 mg via INTRAVENOUS
  Administered 2019-07-17 (×2): 20 mg via INTRAVENOUS

## 2019-07-17 MED ORDER — PROPOFOL 10 MG/ML IV BOLUS
INTRAVENOUS | Status: AC
  Filled 2019-07-17: qty 60

## 2019-07-17 MED ORDER — GLUCAGON HCL RDNA (DIAGNOSTIC) 1 MG IJ SOLR
INTRAMUSCULAR | Status: AC
  Filled 2019-07-17: qty 1

## 2019-07-17 MED ORDER — GLUCAGON HCL RDNA (DIAGNOSTIC) 1 MG IJ SOLR
INTRAMUSCULAR | Status: DC | PRN
  Administered 2019-07-17: 0.25 mg via INTRAVENOUS

## 2019-07-17 MED ORDER — LACTATED RINGERS IV SOLN
INTRAVENOUS | Status: DC
  Administered 2019-07-17: 09:00:00 via INTRAVENOUS
  Administered 2019-07-17: 1000 mL via INTRAVENOUS

## 2019-07-17 MED ORDER — PHENYLEPHRINE 40 MCG/ML (10ML) SYRINGE FOR IV PUSH (FOR BLOOD PRESSURE SUPPORT)
PREFILLED_SYRINGE | INTRAVENOUS | Status: DC | PRN
  Administered 2019-07-17 (×2): 80 ug via INTRAVENOUS

## 2019-07-17 MED ORDER — PROPOFOL 10 MG/ML IV BOLUS
INTRAVENOUS | Status: AC
  Filled 2019-07-17: qty 80

## 2019-07-17 MED ORDER — LIDOCAINE 2% (20 MG/ML) 5 ML SYRINGE
INTRAMUSCULAR | Status: DC | PRN
  Administered 2019-07-17: 100 mg via INTRAVENOUS

## 2019-07-17 MED ORDER — SODIUM CHLORIDE 0.9 % IV SOLN: INTRAVENOUS | Status: DC

## 2019-07-17 MED ORDER — PROPOFOL 500 MG/50ML IV EMUL
INTRAVENOUS | Status: DC | PRN
  Administered 2019-07-17: 100 ug/kg/min via INTRAVENOUS

## 2019-07-17 SURGICAL SUPPLY — 14 items

## 2019-07-17 NOTE — Op Note (Signed)
[facility] Patient Name: Emma Lutz Procedure Date: 07/17/2019 MRN: [id number] Attending MD: Justice Britain , MD Date of Birth: 1956-08-22 CSN: [id number] Age: 63 Admit Type: Inpatient Procedure:                Pouchoscopy Indications:              History of familial polyposis Providers:                Justice Britain, MD, Carlyn Reichert, RN, Josie Dixon, RN, Ladona Ridgel, Technician Referring MD:             Gatha Mayer, MD, Lucilla Edin [place] Medicines:                Monitored Anesthesia Care Complications:            No immediate complications. Estimated Blood Loss:     Estimated blood loss was minimal. Procedure:                Pre-Anesthesia Assessment:                           - Prior to the procedure, a History and Physical                            was performed, and patient medications and                            allergies were reviewed. The patient's tolerance of                            previous anesthesia was also reviewed. The risks                            and benefits of the procedure and the sedation                            options and risks were discussed with the patient.                            All questions were answered, and informed consent                            was obtained. Prior Anticoagulants: The patient has                            taken no previous anticoagulant or antiplatelet                            agents except for NSAID medication. ASA Grade                            Assessment: III - A patient with severe systemic  disease. After reviewing the risks and benefits,                            the patient was deemed in satisfactory condition to                            undergo the procedure.                           After obtaining informed consent, the endoscope was                            passed under direct vision. Throughout  the                            procedure, the patient's blood pressure, pulse, and                            oxygen saturations were monitored continuously. The                            [id number]-H190 ([id number]) Olympus gastroscope was                            introduced through the anus and advanced to the the                            ileoanal pouch and into the neo-terminal ileum.                            After obtaining informed consent, the endoscope was                            passed under direct vision. Throughout the                            procedure, the patient's blood pressure, pulse, and                            oxygen saturations were monitored continuously.The                            procedure was performed without difficulty. The                            patient tolerated the procedure. The quality of the                            bowel preparation was good. Scope In: 9:38:40 AM Scope Out: 9:49:00 AM Total Procedure Duration: 0 hours 10 minutes 20 seconds  Findings:      Hemorrhoids were found on perianal exam.      Patient is status-post total colectomy with a functional end-to-end       ileo-rectal anastomosis.      A localized area  of mucosa in the rectal cuff was nodular and appeared       polypoid as per prior endoscopic evaluation in 2019 imaging. Biopsies       were taken with a cold forceps for histology to rule out adenomatous       tissue.      The pouch contained multiple foreign bodies along the previous       suture/staple line. There was noted nodularity and inflammation in       regions of these staples. Removal of staples was accomplished with a       regular forceps.      A linear segmental area of the ileal reservoir was congested along       staple line. Otherwise healthy appearing tissue noted throughout.      The neo-terminal ileum appeared normal.      Grade I Internal Hemorrhoids noted. Impression:               - Hemorrhoids found  on perianal exam.                           - Nodular ileal mucosa. Biopsied.                           - Presence of staples on staple line with                            irritation in regions. Removal was successful.                           - Congested mucosa in the ileal reservoir along                            suture line but otherwise normal appearing pouch                            tissue.                           - The examined portion of the ileum was normal. Recommendation:           - The patient will be observed post-procedure,                            until all discharge criteria are met.                           - Discharge patient to home.                           - Await pathology results.                           - Repeat post-surgical lower GI endoscopy in 1 year                            for surveillance.                           -  The findings and recommendations were discussed                            with the patient.                           - The findings and recommendations were discussed                            with the patient's family. Procedure Code(s):        --- Professional ---                           [phone], Endoscopic evaluation of small intestinal                            pouch (eg, Kock pouch, ileal reservoir [S or J]);                            with biopsy, single or multiple                           44799, Unlisted procedure, small intestine Diagnosis Code(s):        --- Professional ---                           K63.89, Other specified diseases of intestine                           T18.3XXA, Foreign body in small intestine, initial                            encounter                           K64.9, Unspecified hemorrhoids                           Z86.010, Personal history of colonic polyps CPT copyright 2019 American Medical Association. All rights reserved. The codes documented in this report are preliminary and upon  coder review may  be revised to meet current compliance requirements. Justice Britain, MD 07/17/2019 10:32:06 AM Number of Addenda: 0

## 2019-07-17 NOTE — Discharge Instructions (Signed)
YOU HAD AN ENDOSCOPIC PROCEDURE TODAY: Refer to the procedure report and other information in the discharge instructions given to you for any specific questions about what was found during the examination. If this information does not answer your questions, please call [place] office at [phone] to clarify.  ° °YOU SHOULD EXPECT: Some feelings of bloating in the abdomen. Passage of more gas than usual. Walking can help get rid of the air that was put into your GI tract during the procedure and reduce the bloating. If you had a lower endoscopy (such as a colonoscopy or flexible sigmoidoscopy) you may notice spotting of blood in your stool or on the toilet paper. Some abdominal soreness may be present for a day or two, also. ° °DIET: Your first meal following the procedure should be a light meal and then it is ok to progress to your normal diet. A half-sandwich or bowl of soup is an example of a good first meal. Heavy or fried foods are harder to digest and may make you feel nauseous or bloated. Drink plenty of fluids but you should avoid alcoholic beverages for 24 hours. If you had a esophageal dilation, please see attached instructions for diet.   ° °ACTIVITY: Your care partner should take you home directly after the procedure. You should plan to take it easy, moving slowly for the rest of the day. You can resume normal activity the day after the procedure however YOU SHOULD NOT DRIVE, use power tools, machinery or perform tasks that involve climbing or major physical exertion for 24 hours (because of the sedation medicines used during the test).  ° °SYMPTOMS TO REPORT IMMEDIATELY: °A gastroenterologist can be reached at any hour. Please call [phone]  for any of the following symptoms:  °Following lower endoscopy (colonoscopy, flexible sigmoidoscopy) °Excessive amounts of blood in the stool  °Significant tenderness, worsening of abdominal pains  °Swelling of the abdomen that is new, acute  °Fever of 100° or  higher  °Following upper endoscopy (EGD, EUS, ERCP, esophageal dilation) °Vomiting of blood or coffee ground material  °New, significant abdominal pain  °New, significant chest pain or pain under the shoulder blades  °Painful or persistently difficult swallowing  °New shortness of breath  °Black, tarry-looking or red, bloody stools ° °FOLLOW UP:  °If any biopsies were taken you will be contacted by phone or by letter within the next 1-3 weeks. Call [phone]  if you have not heard about the biopsies in 3 weeks.  °Please also call with any specific questions about appointments or follow up tests. ° °

## 2019-07-17 NOTE — H&P (Signed)
GASTROENTEROLOGY PROCEDURE H&P NOTE   Primary Care Physician: Sherri Rad, MD  HPI: Emma Lutz is a 63 y.o. female who presents for EGD/Flex with EMR attempt.  Past Medical History:  Diagnosis Date  . Allergy   . Anemia yrs ago   no recent iron use  . Clotting disorder ([place]) 2012   post op blood clots in lungs  . Familial adenomatous polyposis    subtotal colectomy, proctectomy (5/12),  duodenal and gastric adenomas, followed by [facility] ([place])  . GERD (gastroesophageal reflux disease)   . Osteoarthritis   . Pancreatitis 2012  . PONV (postoperative nausea and vomiting) 2012   nausea after last surgery  . Pre-diabetes   . 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 Sessile rectal polyp   . Tubular adenoma    gastric, multiple  . Wears glasses   . Wears partial dentures    Past Surgical History:  Procedure Laterality Date  . APPENDECTOMY    . BIOPSY  06/25/2018   Procedure: BIOPSY;  Surgeon: Gatha Mayer, MD;  Location: [facility];  Service: Endoscopy;;  . BIOPSY  11/26/2018   Procedure: BIOPSY;  Surgeon: Irving Copas., MD;  Location: [place];  Service: Gastroenterology;;  . CARPAL TUNNEL RELEASE Left   . ELBOW SURGERY Right   . ESOPHAGOGASTRODUODENOSCOPY    . ESOPHAGOGASTRODUODENOSCOPY N/A 10/16/2014   Procedure: ESOPHAGOGASTRODUODENOSCOPY (EGD);  Surgeon: Gatha Mayer, MD;  Location: [facility];  Service: Endoscopy;  Laterality: N/A;  need ercp scope  . ESOPHAGOGASTRODUODENOSCOPY N/A 12/24/2015   Procedure: ESOPHAGOGASTRODUODENOSCOPY (EGD);  Surgeon: Gatha Mayer, MD;  Location: [facility];  Service: Endoscopy;  Laterality: N/A;  May need ERCP scope  . ESOPHAGOGASTRODUODENOSCOPY (EGD) WITH PROPOFOL N/A 01/10/2017   Procedure: ESOPHAGOGASTRODUODENOSCOPY (EGD) WITH PROPOFOL;  Surgeon: Gatha Mayer, MD;  Location: [facility];  Service: Endoscopy;  Laterality: N/A;  . ESOPHAGOGASTRODUODENOSCOPY (EGD) WITH PROPOFOL N/A  06/25/2018   Procedure: ESOPHAGOGASTRODUODENOSCOPY (EGD) WITH PROPOFOL;  Surgeon: Gatha Mayer, MD;  Location: [facility];  Service: Endoscopy;  Laterality: N/A;  . ESOPHAGOGASTRODUODENOSCOPY (EGD) WITH PROPOFOL N/A 09/17/2018   Procedure: ESOPHAGOGASTRODUODENOSCOPY (EGD) WITH PROPOFOL;  Surgeon: Rush Landmark Telford Nab., MD;  Location: [place];  Service: Gastroenterology;  Laterality: N/A;  . ESOPHAGOGASTRODUODENOSCOPY (EGD) WITH PROPOFOL N/A 11/26/2018   Procedure: ESOPHAGOGASTRODUODENOSCOPY (EGD) WITH PROPOFOL;  Surgeon: Rush Landmark Telford Nab., MD;  Location: [place];  Service: Gastroenterology;  Laterality: N/A;  needs to be a 2 hour case  . FLEXIBLE SIGMOIDOSCOPY    . FLEXIBLE SIGMOIDOSCOPY N/A 10/16/2014   Procedure: FLEXIBLE SIGMOIDOSCOPY;  Surgeon: Gatha Mayer, MD;  Location: [facility];  Service: Endoscopy;  Laterality: N/A;  . FLEXIBLE SIGMOIDOSCOPY N/A 12/24/2015   Procedure: FLEXIBLE SIGMOIDOSCOPY;  Surgeon: Gatha Mayer, MD;  Location: [facility];  Service: Endoscopy;  Laterality: N/A;  . FLEXIBLE SIGMOIDOSCOPY N/A 01/10/2017   Procedure: FLEXIBLE SIGMOIDOSCOPY;  Surgeon: Gatha Mayer, MD;  Location: [facility];  Service: Endoscopy;  Laterality: N/A;  . FLEXIBLE SIGMOIDOSCOPY N/A 06/25/2018   Procedure: FLEXIBLE SIGMOIDOSCOPY;  Surgeon: Gatha Mayer, MD;  Location: [facility];  Service: Endoscopy;  Laterality: N/A;  . HERNIA REPAIR    . ILEOSTOMY CLOSURE  08/17/11   Dr. Brooke Pace  . LUMBAR SPINE SURGERY     X 2  . MULTIPLE TOOTH EXTRACTIONS    . POLYPECTOMY  11/26/2018   Procedure: POLYPECTOMY;  Surgeon: Mansouraty, Telford Nab., MD;  Location: [place];  Service: Gastroenterology;;  .  PROCTECTOMY  02/16/11   with loop ileostomy Drt. [place]  . ROTATOR CUFF REPAIR Right 1990  . TOTAL COLECTOMY  1976   with ileostomy/ileo-rectal anastomosis  . TUBAL LIGATION     Current Facility-Administered Medications  Medication Dose Route Frequency  Provider Last Rate Last Dose  . lactated ringers infusion   Intravenous Continuous Mansouraty, Telford Nab., MD 10 mL/hr at 07/17/19 0709 1,000 mL at 07/17/19 0709   Allergies  Allergen Reactions  . Sulfonamide Derivatives Rash   Family History  Problem Relation Age of Onset  . Heart disease Mother   . Emphysema Mother   . Familial polyposis Father   . Esophageal cancer Father   . Colon cancer Father   . Diabetes Father   . Colon polyps Father   . Kidney disease Father   . Transient ischemic attack Father   . Brain cancer Sister   . Rectal cancer Neg Hx   . Stomach cancer Neg Hx   . Inflammatory bowel disease Neg Hx   . Liver disease Neg Hx   . Pancreatic cancer Neg Hx    Social History   Socioeconomic History  . Marital status: Married    Spouse name: Not on file  . Number of children: 2  . Years of education: Not on file  . Highest education level: Not on file  Occupational History  . Occupation: employed    Fish farm manager: Office manager  . Occupation: Probation officer  Social Needs  . Financial resource strain: Not on file  . Food insecurity    Worry: Not on file    Inability: Not on file  . Transportation needs    Medical: Not on file    Non-medical: Not on file  Tobacco Use  . Smoking status: Never Smoker  . Smokeless tobacco: Never Used  Substance and Sexual Activity  . Alcohol use: No    Alcohol/week: 0.0 standard drinks  . Drug use: No  . Sexual activity: Not on file  Lifestyle  . Physical activity    Days per week: Not on file    Minutes per session: Not on file  . Stress: Not on file  Relationships  . Social Herbalist on phone: Not on file    Gets together: Not on file    Attends religious service: Not on file    Active member of club or organization: Not on file    Attends meetings of clubs or organizations: Not on file    Relationship status: Not on file  . Intimate partner violence    Fear of current or ex partner:  Not on file    Emotionally abused: Not on file    Physically abused: Not on file    Forced sexual activity: Not on file  Other Topics Concern  . Not on file  Social History Narrative   The patient is widowed she has 2 daughters and grandchildren that live in [place]   She works as a Hotel manager support person   2-3 caffeinated beverages daily    Physical Exam: Vital signs in last 24 hours: Temp:  [98.2 F (36.8 C)] 98.2 F (36.8 C) (09/30 0706) Resp:  [15] 15 (09/30 0706) BP: (148)/(86) 148/86 (09/30 0706) SpO2:  [99 %] 99 % (09/30 0706) Weight:  [90.7 kg] 90.7 kg (09/30 0706)   GEN: NAD EYE: Sclerae anicteric ENT: MMM CV: Non-tachycardic GI: Soft, NT/ND NEURO:  Alert & Oriented x 3  Lab Results:  No results for input(s): WBC, HGB, HCT, PLT in the last 72 hours. BMET No results for input(s): NA, K, CL, CO2, GLUCOSE, BUN, CREATININE, CALCIUM in the last 72 hours. LFT No results for input(s): PROT, ALBUMIN, AST, ALT, ALKPHOS, BILITOT, BILIDIR, IBILI in the last 72 hours. PT/INR No results for input(s): LABPROT, INR in the last 72 hours.   Impression / Plan: This is a 63 y.o.female who presents for EGD/Flex with EMR attempt.  The risks and benefits of endoscopic evaluation were discussed with the patient; these include but are not limited to the risk of perforation, infection, bleeding, missed lesions, lack of diagnosis, severe illness requiring hospitalization, as well as anesthesia and sedation related illnesses.  The patient is agreeable to proceed.    Justice Britain, MD [facility] Office # [phone]

## 2019-07-17 NOTE — Op Note (Signed)
[facility] Patient Name: Emma Lutz Procedure Date: 07/17/2019 MRN: [id number] Attending MD: Justice Britain , MD Date of Birth: 1956/03/24 CSN: [id number] Age: 63 Admit Type: Inpatient Procedure:                Upper GI endoscopy Indications:              Surveillance for malignancy due to personal history                            of premalignant condition, Familial Adenomatous                            Polyposis syndrome (2020 EGD with duodenal polyp                            EMR with findings of Adenoma with HGD and Gastric                            Fundic polyps with findigns of HGD) Providers:                Justice Britain, MD, Josie Dixon, RN, Carlyn Reichert, RN, Ladona Ridgel, Technician Referring MD:             Gatha Mayer, MD, Lucilla Edin Branch Medicines:                Monitored Anesthesia Care Complications:            No immediate complications. Estimated Blood Loss:     Estimated blood loss was minimal. Procedure:                Pre-Anesthesia Assessment:                           - Prior to the procedure, a History and Physical                            was performed, and patient medications and                            allergies were reviewed. The patient's tolerance of                            previous anesthesia was also reviewed. The risks                            and benefits of the procedure and the sedation                            options and risks were discussed with the patient.                            All questions were answered, and informed consent  was obtained. Prior Anticoagulants: The patient has                            taken no previous anticoagulant or antiplatelet                            agents except for NSAID medication. ASA Grade                            Assessment: III - A patient with severe systemic   disease. After reviewing the risks and benefits,                            the patient was deemed in satisfactory condition to                            undergo the procedure.                           After obtaining informed consent, the endoscope was                            passed under direct vision. Throughout the                            procedure, the patient's blood pressure, pulse, and                            oxygen saturations were monitored continuously. The                            GIF-H190 [id number] Olympus gastroscope was                            introduced through the mouth, and advanced to the                            second part of duodenum. The [id number]-[id number])                            Olympus duodenoscope was introduced through the                            mouth, and advanced to the second part of duodenum.                            The upper GI endoscopy was technically difficult                            and complex due to inadequate patient positioning.                            Successful completion of the procedure was aided by  performing the maneuvers documented (below) in this                            report. The PCF-H190DL [id number]) Olympus pediatric                            colonscope was introduced through the mouth, and                            advanced to the fourth part of duodenum and allowed                            for the resection of the duodenal lesions. Scope In: Scope Out: Findings:      No gross lesions were noted in the entire esophagus.      Innumberable 1 to 25 mm pedunculated and sessile polyps with bleeding       and stigmata of recent bleeding were found in the cardia, in the gastric       fundus, in the gastric body and at the incisura.      Diffuse nodular and polypoid mucosa was found in the gastric fundus -       this was the area of previous polyp rescetion showing evidence of  HGD. I       remain concerned due to the diffuse appearance and oozing noted in the       region. I took multiple cold snare tissue resections of the area for       pathology purposes.      A single 20 mm pedunculated polyp with bleeding and stigmata of recent       bleeding was found on the greater curvature of the stomach. Preparations       were made for mucosal resection. Orise gel was injected to raise the       lesion. Snare mucosal resection was performed. Resection and retrieval       were complete. Area was successfully injected with 2 mL of a 1:10,000       solution of epinephrine for hemostasis to the region. To prevent       bleeding after mucosal resection, two hemostatic clips were successfully       placed (MR conditional). There was no bleeding at the end of the       procedure.      The gastric antrum was normal.      A medium post mucosectomy scar was found in the second portion of the       duodenum. There was residual polypoid tissue though significantly       improved from prior. Preparations were made for mucosal resection. Orise       gel was injected to raise the lesion. Piecemeal mucosal resection using       a snare was performed. I used the duodenoscope and the pediatric       colonoscope to aid in resection. Resection and retrieval were complete.       To close the defect after mucosal resection, six hemostatic clips were       successfully placed (MR conditional). There was no bleeding at the end       of the procedure.      There was evidence of a patent ampullectomy in the  major papilla and no       recurrent polypoid tissue was present.      Ten to Fifteen sessile polyps were found in the first portion of the       duodenum, in the second portion of the duodenum, in the third portion of       the duodenum and in the fourth portion of the duodenum. The polyps were       2 to greater than 10 mm in size. Impression:               - No gross lesions in  esophagus.                           - Innumberable gastric polyps.                           - Nodular/Polypoid mucosa in the gastric fundus -                            Sampled to rule out peristent HGD.                           - A single gastric polyp. Resected and retrieved.                            Injected with EPI. Clips (MR conditional) were                            placed.                           - Normal antrum.                           - Duodenal scar with residual polypoid tissue.                            Mucosal resection performed. Clips (MR conditional)                            were placed.                           - Patent ampullectomy, characterized by healthy                            appearing mucosa was found.                           - Duodenal polyposis, preliminary classification                            Spigelman stage II-III. Moderate Sedation:      Not Applicable - Patient had care per Anesthesia. Recommendation:           - Proceed to scheduled pouchoscopy.                           -  Await pathology results.                           - Increase PPI to twice daily dosing x 1 month (Rx                            sent to pharmacy) and then decrease back to daily.                           - Hold Celcoxib x 1-week to decrease risk of                            bleeding post procedure.                           - Repeat EGD in 6-12 months based on findings of                            pathology.                           - The findings and recommendations were discussed                            with the patient.                           - The findings and recommendations were discussed                            with the patient's family. Procedure Code(s):        --- Professional ---                           [phone], Esophagogastroduodenoscopy, flexible,                            transoral; with endoscopic mucosal resection Diagnosis  Code(s):        --- Professional ---                           K31.7, Polyp of stomach and duodenum                           K31.89, Other diseases of stomach and duodenum                           Z98.890, Other specified postprocedural states                           Z87.19, Personal history of other diseases of the                            digestive system  D12.6, Benign neoplasm of colon, unspecified CPT copyright 2019 [employer]. All rights reserved. The codes documented in this report are preliminary and upon coder review may  be revised to meet current compliance requirements. Justice Britain, MD 07/17/2019 10:24:18 AM Number of Addenda: 0

## 2019-07-17 NOTE — Anesthesia Postprocedure Evaluation (Signed)
Anesthesia Post Note  Patient: Emma Lutz  Procedure(s) Performed: ESOPHAGOGASTRODUODENOSCOPY (EGD) WITH PROPOFOL (N/A ) ENDOSCOPIC MUCOSAL RESECTION (N/A ) FLEXIBLE SIGMOIDOSCOPY (N/A ) SUBMUCOSAL LIFTING INJECTION HEMOSTASIS CLIP PLACEMENT POLYPECTOMY HEMOSTASIS CONTROL FOREIGN BODY REMOVAL     Patient location during evaluation: PACU Anesthesia Type: MAC Level of consciousness: awake and alert Pain management: pain level controlled Vital Signs Assessment: post-procedure vital signs reviewed and stable Respiratory status: spontaneous breathing, nonlabored ventilation and respiratory function stable Cardiovascular status: stable and blood pressure returned to baseline Anesthetic complications: no    Last Vitals:  Vitals:   07/17/19 1030 07/17/19 1040  BP: 122/66 (!) 141/76  Pulse: 70 72  Resp: (!) 21 (!) 28  Temp:    SpO2: 98% 99%    Last Pain:  Vitals:   07/17/19 1040  TempSrc:   PainSc: 0-No pain                 Audry Pili

## 2019-07-17 NOTE — Anesthesia Procedure Notes (Signed)
Date/Time: 07/17/2019 8:01 AM Performed by: Talbot Grumbling, CRNA Oxygen Delivery Method: Simple face mask

## 2019-07-17 NOTE — Transfer of Care (Signed)
Immediate Anesthesia Transfer of Care Note  Patient: Emma Lutz  Procedure(s) Performed: ESOPHAGOGASTRODUODENOSCOPY (EGD) WITH PROPOFOL (N/A ) ENDOSCOPIC MUCOSAL RESECTION (N/A ) FLEXIBLE SIGMOIDOSCOPY (N/A ) SUBMUCOSAL LIFTING INJECTION HEMOSTASIS CLIP PLACEMENT POLYPECTOMY HEMOSTASIS CONTROL FOREIGN BODY REMOVAL  Patient Location: PACU  Anesthesia Type:MAC  Level of Consciousness: sedated  Airway & Oxygen Therapy: Patient Spontanous Breathing and Patient connected to face mask oxygen  Post-op Assessment: Report given to RN and Post -op Vital signs reviewed and stable  Post vital signs: Reviewed and stable  Last Vitals:  Vitals Value Taken Time  BP 148/130 07/17/19 0956  Temp    Pulse 79 07/17/19 0958  Resp 23 07/17/19 0958  SpO2 100 % 07/17/19 0958  Vitals shown include unvalidated device data.  Last Pain:  Vitals:   07/17/19 0706  TempSrc: Oral  PainSc: 0-No pain         Complications: No apparent anesthesia complications

## 2019-07-18 ENCOUNTER — Telehealth: Payer: Self-pay

## 2019-07-18 NOTE — Telephone Encounter (Signed)
Ms. Schara, Good morning. I will ask Damere Brandenburg to start working on this letter for you and she should reach out to you later today. Thanks.  Justice Britain, MD        You routed conversation to Mansouraty, Telford Nab., MD 17 hours ago (2:35 PM)    [name]  Mansouraty, Telford Nab., MD 18 hours ago (1:58 PM)     I forgot to ask for a return to work note, to be able to return tomorrow ( but actually I'm on vacation this week ) they still had to start FMLA paper work for todays' procedure (in case.) So for them to stop charging as FMLA I need the note saying I'm released for work to resume tomorrow.  Thanks, Shanette  It can be sent to my email and I can forward to HR  person.

## 2019-07-18 NOTE — Telephone Encounter (Signed)
-----   Message from Irving Copas., MD sent at 07/18/2019  8:20 AM EDT ----- Regarding: Follow-up Manpreet Strey,Please see the my chart message that the patient sent yesterday evening and I replied this morning.Can you work on the letter that she needs for her work?Please let me know if there is anything else you need.Thanks.GM

## 2019-07-18 NOTE — Telephone Encounter (Signed)
Work note written and sent to the pt via My chart and pt has been advised.

## 2019-07-22 LAB — SURGICAL PATHOLOGY

## 2019-07-24 ENCOUNTER — Encounter: Payer: Self-pay | Admitting: Gastroenterology

## 2019-07-24 ENCOUNTER — Other Ambulatory Visit: Payer: Self-pay

## 2019-07-24 ENCOUNTER — Telehealth: Payer: Self-pay | Admitting: Gastroenterology

## 2019-07-24 DIAGNOSIS — D509 Iron deficiency anemia, unspecified: Secondary | ICD-10-CM

## 2019-07-24 NOTE — Telephone Encounter (Signed)
I called and spoke with the patient and daughter on 07/23/2019. I reviewed the results of the most recent pathology from her EGD and pouchoscopy. 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 I have discussed this patient's case with Dr. Carlean Purl as well as Dr. Harl Bowie and had an opportunity to discuss with Dr. Dossie Der from [facility] surgery. Dr. Dossie Der is willing to evaluate the patient within his group to consider the role of potential surgical intervention. Not an easy situation as she still has duodenal polyps and this large EMR site that we have worked on that would normally require surveillance and if a complete gastrectomy was performed bypass of this region would be difficult for surveillance. I discussed with the patient and daughter the role for an evaluation. We will hold on imaging per surgery discussion and they will determine what needs to be done. I will send an email to Dr. Dossie Der. We will work on having our staff/nursing group reach out to [facility] specifically sending a referral to Dr. Dossie Der for consideration of surgical intervention for this patient. Patient undergo a CBC/iron/TIBC/ferritin to evaluate for evidence of an iron deficiency that may require IV iron infusion. Patient and daughter appreciative for call back and discussion.   Justice Britain, MD [facility] # [id number]

## 2019-07-24 NOTE — Telephone Encounter (Signed)
Thank you for the update. GM 

## 2019-07-24 NOTE — Telephone Encounter (Signed)
Spoke with pt and she is aware and will come for the labs, orders in epic. Referral faxed to [phone]

## 2019-07-25 ENCOUNTER — Other Ambulatory Visit (INDEPENDENT_AMBULATORY_CARE_PROVIDER_SITE_OTHER): Payer: BC Managed Care – PPO

## 2019-07-25 DIAGNOSIS — D509 Iron deficiency anemia, unspecified: Secondary | ICD-10-CM

## 2019-07-25 LAB — CBC WITH DIFFERENTIAL/PLATELET
Basophils Absolute: 0 10*3/uL (ref 0.0–0.1)
Basophils Relative: 0.9 % (ref 0.0–3.0)
Eosinophils Absolute: 0.1 10*3/uL (ref 0.0–0.7)
Eosinophils Relative: 1.7 % (ref 0.0–5.0)
HCT: 47.5 % — ABNORMAL HIGH (ref 36.0–46.0)
Hemoglobin: 15.7 g/dL — ABNORMAL HIGH (ref 12.0–15.0)
Lymphocytes Relative: 37.3 % (ref 12.0–46.0)
Lymphs Abs: 2.1 10*3/uL (ref 0.7–4.0)
MCHC: 33 g/dL (ref 30.0–36.0)
MCV: 87.8 fl (ref 78.0–100.0)
Monocytes Absolute: 0.6 10*3/uL (ref 0.1–1.0)
Monocytes Relative: 10.2 % (ref 3.0–12.0)
Neutro Abs: 2.9 10*3/uL (ref 1.4–7.7)
Neutrophils Relative %: 49.9 % (ref 43.0–77.0)
Platelets: 171 10*3/uL (ref 150.0–400.0)
RBC: 5.41 Mil/uL — ABNORMAL HIGH (ref 3.87–5.11)
RDW: 13.3 % (ref 11.5–15.5)
WBC: 5.7 10*3/uL (ref 4.0–10.5)

## 2019-07-25 LAB — IRON: Iron: 65 ug/dL (ref 42–145)

## 2019-07-25 LAB — FERRITIN: Ferritin: 20.9 ng/mL (ref 10.0–291.0)

## 2019-07-31 DIAGNOSIS — D126 Benign neoplasm of colon, unspecified: Secondary | ICD-10-CM | POA: Diagnosis not present

## 2019-07-31 DIAGNOSIS — C179 Malignant neoplasm of small intestine, unspecified: Secondary | ICD-10-CM | POA: Diagnosis not present

## 2019-08-07 DIAGNOSIS — Z9049 Acquired absence of other specified parts of digestive tract: Secondary | ICD-10-CM | POA: Diagnosis not present

## 2019-08-07 DIAGNOSIS — D126 Benign neoplasm of colon, unspecified: Secondary | ICD-10-CM | POA: Diagnosis not present

## 2019-08-07 DIAGNOSIS — Z01818 Encounter for other preprocedural examination: Secondary | ICD-10-CM | POA: Diagnosis not present

## 2019-08-08 ENCOUNTER — Other Ambulatory Visit: Payer: Self-pay | Admitting: Internal Medicine

## 2019-08-08 NOTE — Telephone Encounter (Signed)
Labs on October 8th Sir, please advise regarding refill, thank you.

## 2019-08-09 NOTE — Telephone Encounter (Signed)
Dr Carlean Purl sent in refill.

## 2019-08-18 HISTORY — PX: TOTAL GASTRECTOMY: SHX2540

## 2019-08-18 HISTORY — PX: OTHER SURGICAL HISTORY: SHX169

## 2019-08-18 HISTORY — PX: CHOLECYSTECTOMY: SHX55

## 2019-08-28 DIAGNOSIS — Z882 Allergy status to sulfonamides status: Secondary | ICD-10-CM | POA: Diagnosis not present

## 2019-08-28 DIAGNOSIS — Z20828 Contact with and (suspected) exposure to other viral communicable diseases: Secondary | ICD-10-CM | POA: Diagnosis not present

## 2019-08-28 DIAGNOSIS — D131 Benign neoplasm of stomach: Secondary | ICD-10-CM | POA: Diagnosis not present

## 2019-08-28 DIAGNOSIS — Z01818 Encounter for other preprocedural examination: Secondary | ICD-10-CM | POA: Diagnosis not present

## 2019-08-28 DIAGNOSIS — Z903 Acquired absence of stomach [part of]: Secondary | ICD-10-CM | POA: Diagnosis not present

## 2019-08-28 DIAGNOSIS — G8918 Other acute postprocedural pain: Secondary | ICD-10-CM | POA: Diagnosis not present

## 2019-08-28 DIAGNOSIS — C179 Malignant neoplasm of small intestine, unspecified: Secondary | ICD-10-CM | POA: Diagnosis not present

## 2019-08-28 DIAGNOSIS — K660 Peritoneal adhesions (postprocedural) (postinfection): Secondary | ICD-10-CM | POA: Diagnosis not present

## 2019-08-28 DIAGNOSIS — K219 Gastro-esophageal reflux disease without esophagitis: Secondary | ICD-10-CM | POA: Diagnosis not present

## 2019-08-28 DIAGNOSIS — K317 Polyp of stomach and duodenum: Secondary | ICD-10-CM | POA: Diagnosis not present

## 2019-08-28 DIAGNOSIS — K811 Chronic cholecystitis: Secondary | ICD-10-CM | POA: Diagnosis not present

## 2019-08-28 DIAGNOSIS — D126 Benign neoplasm of colon, unspecified: Secondary | ICD-10-CM | POA: Diagnosis not present

## 2019-08-28 DIAGNOSIS — D1330 Benign neoplasm of unspecified part of small intestine: Secondary | ICD-10-CM | POA: Diagnosis not present

## 2019-08-28 DIAGNOSIS — F329 Major depressive disorder, single episode, unspecified: Secondary | ICD-10-CM | POA: Diagnosis not present

## 2019-08-31 DIAGNOSIS — G8918 Other acute postprocedural pain: Secondary | ICD-10-CM | POA: Diagnosis not present

## 2019-09-01 DIAGNOSIS — G8918 Other acute postprocedural pain: Secondary | ICD-10-CM | POA: Diagnosis not present

## 2019-09-02 DIAGNOSIS — G8918 Other acute postprocedural pain: Secondary | ICD-10-CM | POA: Diagnosis not present

## 2019-09-05 DIAGNOSIS — G8918 Other acute postprocedural pain: Secondary | ICD-10-CM | POA: Diagnosis not present

## 2019-09-06 DIAGNOSIS — G8918 Other acute postprocedural pain: Secondary | ICD-10-CM | POA: Diagnosis not present

## 2019-09-18 DIAGNOSIS — D126 Benign neoplasm of colon, unspecified: Secondary | ICD-10-CM | POA: Diagnosis not present

## 2019-09-18 DIAGNOSIS — C179 Malignant neoplasm of small intestine, unspecified: Secondary | ICD-10-CM | POA: Diagnosis not present

## 2019-10-08 ENCOUNTER — Telehealth: Payer: Self-pay | Admitting: Gastroenterology

## 2019-10-08 NOTE — Telephone Encounter (Signed)
Please review previous message and advise 

## 2019-10-08 NOTE — Telephone Encounter (Signed)
Called and spoke with patient-patient informed of MD recommendations and patient is agreeable to plan of care and has been scheduled for an appt on 11/11/2019 at 2:50 pm; Patient advised to call back to the office at [phone] should questions/concerns arise;  Patient verbalized understanding of information/instructions;

## 2019-10-08 NOTE — Telephone Encounter (Signed)
Lesly Rubenstein, Dr. Carlean Purl is her primary GI. I would set up a follow up with Dr. Carlean Purl to discuss this. I think she was previously on a NSAID but I think this discussion and possible medication such as Sulindac or Celecoxib could be considered. Thanks. GM

## 2019-10-23 DIAGNOSIS — Z9049 Acquired absence of other specified parts of digestive tract: Secondary | ICD-10-CM | POA: Diagnosis not present

## 2019-10-23 DIAGNOSIS — D126 Benign neoplasm of colon, unspecified: Secondary | ICD-10-CM | POA: Diagnosis not present

## 2019-10-30 ENCOUNTER — Encounter: Payer: Self-pay | Admitting: Gastroenterology

## 2019-10-30 NOTE — Progress Notes (Signed)
I received a KUB report from [facility] about this patient. I will scanned this into the chart and give a copy to the patient's primary gastroenterologist Dr. Carlean Purl.  This is a follow-up from 10/23/2019. 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 Gaseous dilation of the small bowel which may represent obstruction or functional dilation status post colectomy.  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 This may be potentially reachable with pediatric colonoscope but if it is out of our ability to reach it could require deep balloon enteroscopy.  Patient has a scheduled clinic visit with Dr. Carlean Purl later this month. I will forward this to him. We will discuss attempt at removing this retained pancreatic stent placed during surgery.  Justice Britain, MD [facility] # [phone]

## 2019-10-31 NOTE — Progress Notes (Signed)
Noted, Emma Lutz  I am hospital doc next week - let's talk beginning of week or message back if you think I should take a shot -

## 2019-10-31 NOTE — Progress Notes (Signed)
Emma Lutz, I think it is hard to know but hopefully reading through the operative note we should be able to reach it if the Roux limb is not too significantly long.  I be happy to get her in for a procedure but I think he you are just is attuned to be able to try and get it yourself if you want to get it.  I am keeping my Mondays even though we want to have blocks so that I can get urgent procedures in if needed so if that helps you make a decision after you see her let me know.  I would not be planning to do a ERCP unless we saw that the stent had actually migrated inwards.  Thanks. GM

## 2019-11-11 ENCOUNTER — Encounter: Payer: Self-pay | Admitting: Internal Medicine

## 2019-11-11 ENCOUNTER — Other Ambulatory Visit (INDEPENDENT_AMBULATORY_CARE_PROVIDER_SITE_OTHER): Payer: BC Managed Care – PPO

## 2019-11-11 ENCOUNTER — Ambulatory Visit: Payer: BC Managed Care – PPO | Admitting: Internal Medicine

## 2019-11-11 VITALS — BP 120/80 | Temp 97.9°F | Ht 62.0 in | Wt 179.0 lb

## 2019-11-11 DIAGNOSIS — Z903 Acquired absence of stomach [part of]: Secondary | ICD-10-CM

## 2019-11-11 DIAGNOSIS — D132 Benign neoplasm of duodenum: Secondary | ICD-10-CM

## 2019-11-11 DIAGNOSIS — D131 Benign neoplasm of stomach: Secondary | ICD-10-CM

## 2019-11-11 DIAGNOSIS — D126 Benign neoplasm of colon, unspecified: Secondary | ICD-10-CM

## 2019-11-11 LAB — COMPREHENSIVE METABOLIC PANEL
ALT: 13 U/L (ref 0–35)
AST: 18 U/L (ref 0–37)
Albumin: 4.2 g/dL (ref 3.5–5.2)
Alkaline Phosphatase: 87 U/L (ref 39–117)
BUN: 12 mg/dL (ref 6–23)
CO2: 27 mEq/L (ref 19–32)
Calcium: 9.8 mg/dL (ref 8.4–10.5)
Chloride: 105 mEq/L (ref 96–112)
Creatinine, Ser: 0.56 mg/dL (ref 0.40–1.20)
GFR: 108.81 mL/min (ref >60.00)
Glucose, Bld: 106 mg/dL — ABNORMAL HIGH (ref 70–99)
Potassium: 4.2 mEq/L (ref 3.5–5.1)
Sodium: 139 mEq/L (ref 135–145)
Total Bilirubin: 0.9 mg/dL (ref 0.2–1.2)
Total Protein: 7.3 g/dL (ref 6.0–8.3)

## 2019-11-11 LAB — CBC WITH DIFFERENTIAL/PLATELET
Basophils Absolute: 0.1 10*3/uL (ref 0.0–0.1)
Basophils Relative: 0.8 % (ref 0.0–3.0)
Eosinophils Absolute: 0.2 10*3/uL (ref 0.0–0.7)
Eosinophils Relative: 2.3 % (ref 0.0–5.0)
HCT: 44.8 % (ref 36.0–46.0)
Hemoglobin: 14.3 g/dL (ref 12.0–15.0)
Lymphocytes Relative: 43.1 % (ref 12.0–46.0)
Lymphs Abs: 2.9 10*3/uL (ref 0.7–4.0)
MCHC: 32 g/dL (ref 30.0–36.0)
MCV: 85 fl (ref 78.0–100.0)
Monocytes Absolute: 0.6 10*3/uL (ref 0.1–1.0)
Monocytes Relative: 9.2 % (ref 3.0–12.0)
Neutro Abs: 2.9 10*3/uL (ref 1.4–7.7)
Neutrophils Relative %: 44.6 % (ref 43.0–77.0)
Platelets: 201 10*3/uL (ref 150.0–400.0)
RBC: 5.26 Mil/uL — ABNORMAL HIGH (ref 3.87–5.11)
RDW: 15 % (ref 11.5–15.5)
WBC: 6.6 10*3/uL (ref 4.0–10.5)

## 2019-11-11 LAB — VITAMIN B12: Vitamin B-12: 437 pg/mL (ref 211–911)

## 2019-11-11 LAB — FERRITIN: Ferritin: 10.6 ng/mL (ref 10.0–291.0)

## 2019-11-11 NOTE — Patient Instructions (Signed)

## 2019-11-11 NOTE — Progress Notes (Signed)
Emma Lutz 64 y.o. Feb 03, 1956 [id number]  Assessment & Plan:   Encounter Diagnoses  Name Primary?  . FAP (familial adenomatous polyposis) Yes  . S/P gastrectomy   . Adenomatous duodenal polyp   . Adenomatous polyp of stomach     She seems to be doing about as well as can be expected after her major resection.  We talked about eating small frequent meals.  She is going to need an endoscopic removal of the retained pancreatic stent.  We will work out the timing of that.  I do not think she needs to take a Cox 2 inhibitor anymore.  I am not certain when we should look again after her surgery I would think 6 months at the earliest postop.  Will pull my colleagues.  We will clarify the issue about Cox 2 inhibitor as well.  She is at risk for malabsorption of B12 and iron as well.  I am going to get some baseline labs.  These were done.  Lab Results  Component Value Date   WBC 6.6 11/11/2019   HGB 14.3 11/11/2019   HCT 44.8 11/11/2019   MCV 85.0 11/11/2019   PLT 201.0 11/11/2019   Lab Results  Component Value Date   CREATININE 0.56 11/11/2019   BUN 12 11/11/2019   NA 139 11/11/2019   K 4.2 11/11/2019   CL 105 11/11/2019   CO2 27 11/11/2019   Lab Results  Component Value Date   ALT 13 11/11/2019   AST 18 11/11/2019   ALKPHOS 87 11/11/2019   BILITOT 0.9 11/11/2019   Lab Results  Component Value Date   FERRITIN 10.6 11/11/2019   Lab Results  Component Value Date   VITAMINB12 437 11/11/2019   B12 is currently okay.  She is iron deficient.  I do not think she will absorb iron and will need parenteral treatment.  We will arrange that.  Subjective:   Chief Complaint: Follow-up after surgery for polyposis in the stomach and duodenum  HPI Emma Lutz is here for follow-up, she had multiple gastric and duodenal polyps some with high-grade dysplasia discovered last year and we tried to remove these, Dr. Stefani Dama Lutz tried to do so but because of the problems she was  referred to [facility] and Dr. Donnal Lutz did surgery.  She had a complicated surgery on November 13, where she had total gastrectomy resection of the proximal small intestine and partial removal of the pancreas, cholecystectomy.  She did well after the surgery and she is adjusting to this new situation she certainly cannot eat like she used to.  She wonders if she should still take a Cox 2 inhibitor like she did in the past to prevent polyps.  She relates that Dr. Harl Lutz indicated that was really just for the duodenal adenomas.  Her duodenum is gone.  She also has a retained stent in her pancreatico enteric anastomosis.  She says Dr. Donnal Lutz that could be removed the next time we do polyp surveillance that we do not need to be in a hurry for that.  She has been told she needs B12 shots.  No labs since hospitalization late 2020.  Her drains are gone.  They actually fell out.  Pathology specimens reviewed showed polyps with low-grade dysplasia and also some with high-grade dysplasia in the stomach but not in the duodenum. Allergies  Allergen Reactions  . Sulfonamide Derivatives Rash   Current Meds  Medication Sig  . cholecalciferol (VITAMIN D) 1000 units tablet Take 1,000 Units  by mouth daily.  . Cyanocobalamin (VITAMIN B 12 PO) Take 1 capsule by mouth daily.  Marland Kitchen loratadine (CLARITIN) 10 MG tablet Take 10 mg by mouth daily as needed for allergies.  . Multiple Vitamin (MULTIVITAMIN) tablet Take 1 tablet by mouth daily.  Marland Kitchen PARoxetine (PAXIL-CR) 25 MG 24 hr tablet Take 25 mg by mouth daily.    Past Medical History:  Diagnosis Date  . Allergy   . Anemia yrs ago   no recent iron use  . Clotting disorder ([place]) 2012   post op blood clots in lungs  . Familial adenomatous polyposis    subtotal colectomy, proctectomy (5/12),  duodenal and gastric adenomas, followed by [facility] ([place])  . GERD (gastroesophageal reflux disease)   . Osteoarthritis   . Pancreatitis 2012  . PONV (postoperative nausea and vomiting) 2012    nausea after last surgery  . Pre-diabetes   . Pulmonary embolism ([place]) 02/2012   multiple  . Sessile rectal polyp   . Tubular adenoma    gastric, multiple  . Wears glasses   . Wears partial dentures    Past Surgical History:  Procedure Laterality Date  . APPENDECTOMY    . BIOPSY  06/25/2018   Procedure: BIOPSY;  Surgeon: Gatha Mayer, MD;  Location: [facility];  Service: Endoscopy;;  . BIOPSY  11/26/2018   Procedure: BIOPSY;  Surgeon: Irving Copas., MD;  Location: [place];  Service: Gastroenterology;;  . CARPAL TUNNEL RELEASE Left   . ELBOW SURGERY Right   . ENDOSCOPIC MUCOSAL RESECTION N/A 07/17/2019   Procedure: ENDOSCOPIC MUCOSAL RESECTION;  Surgeon: Rush Landmark Telford Nab., MD;  Location: [facility];  Service: Gastroenterology;  Laterality: N/A;  . ESOPHAGOGASTRODUODENOSCOPY    . ESOPHAGOGASTRODUODENOSCOPY N/A 10/16/2014   Procedure: ESOPHAGOGASTRODUODENOSCOPY (EGD);  Surgeon: Gatha Mayer, MD;  Location: [facility];  Service: Endoscopy;  Laterality: N/A;  need ercp scope  . ESOPHAGOGASTRODUODENOSCOPY N/A 12/24/2015   Procedure: ESOPHAGOGASTRODUODENOSCOPY (EGD);  Surgeon: Gatha Mayer, MD;  Location: [facility];  Service: Endoscopy;  Laterality: N/A;  May need ERCP scope  . ESOPHAGOGASTRODUODENOSCOPY (EGD) WITH PROPOFOL N/A 01/10/2017   Procedure: ESOPHAGOGASTRODUODENOSCOPY (EGD) WITH PROPOFOL;  Surgeon: Gatha Mayer, MD;  Location: [facility];  Service: Endoscopy;  Laterality: N/A;  . ESOPHAGOGASTRODUODENOSCOPY (EGD) WITH PROPOFOL N/A 06/25/2018   Procedure: ESOPHAGOGASTRODUODENOSCOPY (EGD) WITH PROPOFOL;  Surgeon: Gatha Mayer, MD;  Location: [facility];  Service: Endoscopy;  Laterality: N/A;  . ESOPHAGOGASTRODUODENOSCOPY (EGD) WITH PROPOFOL N/A 09/17/2018   Procedure: ESOPHAGOGASTRODUODENOSCOPY (EGD) WITH PROPOFOL;  Surgeon: Rush Landmark Telford Nab., MD;  Location: [place];  Service: Gastroenterology;  Laterality: N/A;  .  ESOPHAGOGASTRODUODENOSCOPY (EGD) WITH PROPOFOL N/A 11/26/2018   Procedure: ESOPHAGOGASTRODUODENOSCOPY (EGD) WITH PROPOFOL;  Surgeon: Rush Landmark Telford Nab., MD;  Location: [place];  Service: Gastroenterology;  Laterality: N/A;  needs to be a 2 hour case  . ESOPHAGOGASTRODUODENOSCOPY (EGD) WITH PROPOFOL N/A 07/17/2019   Procedure: ESOPHAGOGASTRODUODENOSCOPY (EGD) WITH PROPOFOL;  Surgeon: Rush Landmark Telford Nab., MD;  Location: [facility];  Service: Gastroenterology;  Laterality: N/A;  . FLEXIBLE SIGMOIDOSCOPY    . FLEXIBLE SIGMOIDOSCOPY N/A 10/16/2014   Procedure: FLEXIBLE SIGMOIDOSCOPY;  Surgeon: Gatha Mayer, MD;  Location: [facility];  Service: Endoscopy;  Laterality: N/A;  . FLEXIBLE SIGMOIDOSCOPY N/A 12/24/2015   Procedure: FLEXIBLE SIGMOIDOSCOPY;  Surgeon: Gatha Mayer, MD;  Location: [facility];  Service: Endoscopy;  Laterality: N/A;  . FLEXIBLE SIGMOIDOSCOPY N/A 01/10/2017   Procedure: FLEXIBLE SIGMOIDOSCOPY;  Surgeon: Gatha Mayer, MD;  Location: [facility];  Service: Endoscopy;  Laterality: N/A;  . FLEXIBLE SIGMOIDOSCOPY N/A 06/25/2018   Procedure: FLEXIBLE SIGMOIDOSCOPY;  Surgeon: Gatha Mayer, MD;  Location: [facility];  Service: Endoscopy;  Laterality: N/A;  . FLEXIBLE SIGMOIDOSCOPY N/A 07/17/2019   Procedure: FLEXIBLE SIGMOIDOSCOPY;  Surgeon: Irving Copas., MD;  Location: [facility];  Service: Gastroenterology;  Laterality: N/A;  . FOREIGN BODY REMOVAL  07/17/2019   Procedure: FOREIGN BODY REMOVAL;  Surgeon: Rush Landmark Telford Nab., MD;  Location: [facility];  Service: Gastroenterology;;  . HEMOSTASIS CLIP PLACEMENT  07/17/2019   Procedure: HEMOSTASIS CLIP PLACEMENT;  Surgeon: Irving Copas., MD;  Location: [facility];  Service: Gastroenterology;;  . Laretta Alstrom CONTROL  07/17/2019   Procedure: HEMOSTASIS CONTROL;  Surgeon: Irving Copas., MD;  Location: [facility];  Service: Gastroenterology;;  EPI  . HERNIA REPAIR    . ILEOSTOMY  CLOSURE  08/17/11   Dr. Brooke Pace  . LUMBAR SPINE SURGERY     X 2  . MULTIPLE TOOTH EXTRACTIONS    . POLYPECTOMY  11/26/2018   Procedure: POLYPECTOMY;  Surgeon: Mansouraty, Telford Nab., MD;  Location: [place];  Service: Gastroenterology;;  . POLYPECTOMY  07/17/2019   Procedure: POLYPECTOMY;  Surgeon: Irving Copas., MD;  Location: [facility];  Service: Gastroenterology;;  . PROCTECTOMY  02/16/11   with loop ileostomy Drt. [place]  . ROTATOR CUFF REPAIR Right 1990  . SUBMUCOSAL LIFTING INJECTION  07/17/2019   Procedure: SUBMUCOSAL LIFTING INJECTION;  Surgeon: Rush Landmark Telford Nab., MD;  Location: [facility];  Service: Gastroenterology;;  . [place]   with ileostomy/ileo-rectal anastomosis  . TUBAL LIGATION     Social History   Social History Narrative   The patient is widowed she has 2 daughters and grandchildren that live in [place]   She works as a Hotel manager support person   2-3 caffeinated beverages daily   family history includes Brain cancer in her sister; Colon cancer in her father; Colon polyps in her father; Diabetes in her father; Emphysema in her mother; Esophageal cancer in her father; Familial polyposis in her father; Heart disease in her mother; Kidney disease in her father; Transient ischemic attack in her father.   Review of Systems As above  Objective:   Physical Exam BP 120/80   Temp 97.9 F (36.6 C)   Ht 5\' 2"  (1.575 m)   Wt 179 lb (81.2 kg)   BMI 32.74 kg/m  NAD Lungs cta Cor NL abd obese soft NT well healed scars

## 2019-11-13 ENCOUNTER — Encounter: Payer: Self-pay | Admitting: Internal Medicine

## 2019-11-25 ENCOUNTER — Encounter: Payer: Self-pay | Admitting: Internal Medicine

## 2019-11-25 DIAGNOSIS — D508 Other iron deficiency anemias: Secondary | ICD-10-CM | POA: Insufficient documentation

## 2019-11-26 ENCOUNTER — Other Ambulatory Visit: Payer: Self-pay | Admitting: *Deleted

## 2019-11-26 ENCOUNTER — Encounter: Payer: Self-pay | Admitting: *Deleted

## 2019-11-26 ENCOUNTER — Telehealth: Payer: Self-pay | Admitting: Internal Medicine

## 2019-11-26 DIAGNOSIS — D126 Benign neoplasm of colon, unspecified: Secondary | ICD-10-CM

## 2019-11-26 DIAGNOSIS — D132 Benign neoplasm of duodenum: Secondary | ICD-10-CM

## 2019-11-26 DIAGNOSIS — D509 Iron deficiency anemia, unspecified: Secondary | ICD-10-CM

## 2019-11-26 NOTE — Telephone Encounter (Signed)
See result notes. 

## 2019-11-29 ENCOUNTER — Ambulatory Visit (HOSPITAL_COMMUNITY)
Admission: RE | Admit: 2019-11-29 | Discharge: 2019-11-29 | Disposition: A | Discharge status: Home / Self Care | Payer: BC Managed Care – PPO | Source: Ambulatory Visit | Attending: Internal Medicine | Admitting: Internal Medicine

## 2019-11-29 ENCOUNTER — Other Ambulatory Visit: Payer: Self-pay

## 2019-11-29 DIAGNOSIS — D509 Iron deficiency anemia, unspecified: Secondary | ICD-10-CM

## 2019-11-29 MED ORDER — SODIUM CHLORIDE 0.9 % IV SOLN
INTRAVENOUS | Status: DC | PRN
  Administered 2019-11-29: 250 mL via INTRAVENOUS

## 2019-11-29 MED ORDER — SODIUM CHLORIDE 0.9 % IV SOLN
510.0000 mg | INTRAVENOUS | Status: DC
  Administered 2019-11-29: 510 mg via INTRAVENOUS
  Filled 2019-11-29: qty 17

## 2019-11-29 NOTE — Discharge Instructions (Signed)

## 2019-11-29 NOTE — Progress Notes (Signed)
PATIENT CARE CENTER NOTE  Diagnosis: Iron deficiency anemia, unspecified iron deficiency anemia type (D50.9)   Provider: Silvano Rusk. MD   Procedure: Feraheme infusion   Note: Patient received Feraheme infusion via PIV. Tolerated well with no adverse reaction. Observed patient for 30 minutes post-infusion. Vital signs stable. Discharge instructions given. Patient alert, oriented and ambulatory at discharge.

## 2019-12-09 ENCOUNTER — Ambulatory Visit (HOSPITAL_COMMUNITY)
Admission: RE | Admit: 2019-12-09 | Discharge: 2019-12-09 | Disposition: A | Discharge status: Home / Self Care | Payer: BC Managed Care – PPO | Source: Ambulatory Visit | Attending: Internal Medicine | Admitting: Internal Medicine

## 2019-12-09 ENCOUNTER — Other Ambulatory Visit: Payer: Self-pay

## 2019-12-09 DIAGNOSIS — D509 Iron deficiency anemia, unspecified: Secondary | ICD-10-CM | POA: Diagnosis not present

## 2019-12-09 MED ORDER — SODIUM CHLORIDE 0.9 % IV SOLN
INTRAVENOUS | Status: DC | PRN
  Administered 2019-12-09: 10:00:00 250 mL via INTRAVENOUS

## 2019-12-09 MED ORDER — SODIUM CHLORIDE 0.9 % IV SOLN
510.0000 mg | Freq: Once | INTRAVENOUS | Status: AC
  Administered 2019-12-09: 11:00:00 510 mg via INTRAVENOUS
  Filled 2019-12-09: qty 510

## 2019-12-09 NOTE — Progress Notes (Signed)
Patient received IV Feraheme  as ordered by Silvano Rusk MD. Observed for at least 30 minutes post infusion.Tolerated well, vitals stable, discharge instructions given, verbalized understanding. Patient alert, oriented and ambulatory at the time of discharge.

## 2019-12-09 NOTE — Discharge Instructions (Signed)

## 2019-12-11 ENCOUNTER — Telehealth: Payer: Self-pay | Admitting: Internal Medicine

## 2019-12-11 NOTE — Telephone Encounter (Signed)
Patient told nothing to eat or drink after midnight. Verbalized understanding.

## 2019-12-11 NOTE — Telephone Encounter (Signed)
Pt is scheduled for a hospital procedure 12/16/19 and would like to know how to prep for procedure.

## 2019-12-11 NOTE — Progress Notes (Signed)
Attempted to obtain medical history via telephone, unable to reach at this time. I left a voicemail to return pre surgical testing department's phone call.  

## 2019-12-12 ENCOUNTER — Encounter (HOSPITAL_COMMUNITY): Payer: Self-pay | Admitting: Internal Medicine

## 2019-12-12 ENCOUNTER — Other Ambulatory Visit (HOSPITAL_COMMUNITY)
Admission: RE | Admit: 2019-12-12 | Discharge: 2019-12-12 | Disposition: A | Discharge status: Home / Self Care | Payer: BC Managed Care – PPO | Source: Ambulatory Visit | Attending: Internal Medicine | Admitting: Internal Medicine

## 2019-12-12 DIAGNOSIS — Z20822 Contact with and (suspected) exposure to covid-19: Secondary | ICD-10-CM | POA: Insufficient documentation

## 2019-12-12 DIAGNOSIS — Z01812 Encounter for preprocedural laboratory examination: Secondary | ICD-10-CM | POA: Insufficient documentation

## 2019-12-12 LAB — SARS CORONAVIRUS 2 (TAT 6-24 HRS): SARS Coronavirus 2: NEGATIVE

## 2019-12-12 NOTE — Progress Notes (Signed)
Attempted to obtain medical history via telephone, unable to reach at this time. I left a voicemail to return pre surgical testing department's phone call.  

## 2019-12-16 ENCOUNTER — Ambulatory Visit (HOSPITAL_COMMUNITY): Payer: BC Managed Care – PPO

## 2019-12-16 ENCOUNTER — Other Ambulatory Visit: Payer: Self-pay

## 2019-12-16 ENCOUNTER — Ambulatory Visit (HOSPITAL_COMMUNITY): Payer: BC Managed Care – PPO | Admitting: Certified Registered"

## 2019-12-16 ENCOUNTER — Other Ambulatory Visit: Payer: Self-pay | Admitting: Internal Medicine

## 2019-12-16 ENCOUNTER — Encounter (HOSPITAL_COMMUNITY): Payer: Self-pay | Admitting: Internal Medicine

## 2019-12-16 ENCOUNTER — Encounter (HOSPITAL_COMMUNITY)
Admission: RE | Disposition: A | Discharge status: Home / Self Care | Payer: Self-pay | Source: Home / Self Care | Attending: Internal Medicine

## 2019-12-16 ENCOUNTER — Ambulatory Visit (HOSPITAL_COMMUNITY)
Admission: RE | Admit: 2019-12-16 | Discharge: 2019-12-16 | Disposition: A | Discharge status: Home / Self Care | Payer: BC Managed Care – PPO | Attending: Internal Medicine | Admitting: Internal Medicine

## 2019-12-16 DIAGNOSIS — Z4659 Encounter for fitting and adjustment of other gastrointestinal appliance and device: Secondary | ICD-10-CM | POA: Insufficient documentation

## 2019-12-16 DIAGNOSIS — T189XXA Foreign body of alimentary tract, part unspecified, initial encounter: Secondary | ICD-10-CM | POA: Diagnosis not present

## 2019-12-16 DIAGNOSIS — Z800 Family history of malignant neoplasm of digestive organs: Secondary | ICD-10-CM | POA: Insufficient documentation

## 2019-12-16 DIAGNOSIS — Z4689 Encounter for fitting and adjustment of other specified devices: Secondary | ICD-10-CM

## 2019-12-16 DIAGNOSIS — Z86711 Personal history of pulmonary embolism: Secondary | ICD-10-CM | POA: Insufficient documentation

## 2019-12-16 DIAGNOSIS — Z980 Intestinal bypass and anastomosis status: Secondary | ICD-10-CM | POA: Insufficient documentation

## 2019-12-16 DIAGNOSIS — Z8719 Personal history of other diseases of the digestive system: Secondary | ICD-10-CM | POA: Diagnosis not present

## 2019-12-16 DIAGNOSIS — Z8371 Family history of colonic polyps: Secondary | ICD-10-CM | POA: Diagnosis not present

## 2019-12-16 DIAGNOSIS — D132 Benign neoplasm of duodenum: Secondary | ICD-10-CM | POA: Diagnosis not present

## 2019-12-16 DIAGNOSIS — Z9049 Acquired absence of other specified parts of digestive tract: Secondary | ICD-10-CM | POA: Diagnosis not present

## 2019-12-16 DIAGNOSIS — Z903 Acquired absence of stomach [part of]: Secondary | ICD-10-CM | POA: Diagnosis not present

## 2019-12-16 DIAGNOSIS — D126 Benign neoplasm of colon, unspecified: Secondary | ICD-10-CM | POA: Diagnosis not present

## 2019-12-16 DIAGNOSIS — Z79899 Other long term (current) drug therapy: Secondary | ICD-10-CM | POA: Diagnosis not present

## 2019-12-16 DIAGNOSIS — M1990 Unspecified osteoarthritis, unspecified site: Secondary | ICD-10-CM | POA: Diagnosis not present

## 2019-12-16 DIAGNOSIS — K219 Gastro-esophageal reflux disease without esophagitis: Secondary | ICD-10-CM | POA: Diagnosis not present

## 2019-12-16 DIAGNOSIS — D509 Iron deficiency anemia, unspecified: Secondary | ICD-10-CM

## 2019-12-16 HISTORY — PX: ENTEROSCOPY: SHX5533

## 2019-12-16 HISTORY — PX: GASTROINTESTINAL STENT REMOVAL: SHX6384

## 2019-12-16 SURGERY — ENTEROSCOPY
Anesthesia: Monitor Anesthesia Care

## 2019-12-16 MED ORDER — SODIUM CHLORIDE 0.9 % IV SOLN: INTRAVENOUS | Status: DC

## 2019-12-16 MED ORDER — PROPOFOL 500 MG/50ML IV EMUL
INTRAVENOUS | Status: DC | PRN
  Administered 2019-12-16 (×2): 20 mg via INTRAVENOUS
  Administered 2019-12-16: 30 mg via INTRAVENOUS

## 2019-12-16 MED ORDER — PROPOFOL 500 MG/50ML IV EMUL
INTRAVENOUS | Status: DC | PRN
  Administered 2019-12-16: 150 ug/kg/min via INTRAVENOUS

## 2019-12-16 MED ORDER — PROPOFOL 10 MG/ML IV BOLUS
INTRAVENOUS | Status: AC
  Filled 2019-12-16: qty 20

## 2019-12-16 MED ORDER — LIDOCAINE HCL (CARDIAC) PF 100 MG/5ML IV SOSY
PREFILLED_SYRINGE | INTRAVENOUS | Status: DC | PRN
  Administered 2019-12-16: 100 mg via INTRATRACHEAL

## 2019-12-16 MED ORDER — LACTATED RINGERS IV SOLN
INTRAVENOUS | Status: DC
  Administered 2019-12-16: 1000 mL via INTRAVENOUS

## 2019-12-16 NOTE — Anesthesia Postprocedure Evaluation (Signed)
Anesthesia Post Note  Patient: Emma Lutz  Procedure(s) Performed: ENTEROSCOPY (N/A ) GASTROINTESTINAL STENT REMOVAL     Patient location during evaluation: PACU Anesthesia Type: MAC Level of consciousness: awake and alert Pain management: pain level controlled Vital Signs Assessment: post-procedure vital signs reviewed and stable Respiratory status: spontaneous breathing, nonlabored ventilation, respiratory function stable and patient connected to nasal cannula oxygen Cardiovascular status: stable and blood pressure returned to baseline Postop Assessment: no apparent nausea or vomiting Anesthetic complications: no    Last Vitals:  Vitals:   12/16/19 1305 12/16/19 1309  BP:  (!) 95/56  Pulse: 73 69  Resp: 18 (!) 23  Temp:    SpO2: 98% 98%    Last Pain:  Vitals:   12/16/19 1310  TempSrc:   PainSc: 0-No pain                 Breuna Loveall DAVID

## 2019-12-16 NOTE — Transfer of Care (Signed)
Immediate Anesthesia Transfer of Care Note  Patient: Emma Lutz  Procedure(s) Performed: ENTEROSCOPY (N/A ) GASTROINTESTINAL STENT REMOVAL  Patient Location: Endoscopy Unit  Anesthesia Type:MAC  Level of Consciousness: awake, oriented and patient cooperative  Airway & Oxygen Therapy: Patient Spontanous Breathing and Patient connected to face mask oxygen  Post-op Assessment: Report given to RN and Post -op Vital signs reviewed and stable  Post vital signs: Reviewed and stable  Last Vitals:  Vitals Value Taken Time  BP 104/59 12/16/19 1247  Temp    Pulse 84 12/16/19 1249  Resp 19 12/16/19 1249  SpO2 100 % 12/16/19 1249  Vitals shown include unvalidated device data.  Last Pain:  Vitals:   12/16/19 1247  TempSrc:   PainSc: 0-No pain         Complications: No apparent anesthesia complications

## 2019-12-16 NOTE — Anesthesia Preprocedure Evaluation (Addendum)
Anesthesia Evaluation  Patient identified by MRN, date of birth, ID band Patient awake    Reviewed: Allergy & Precautions, NPO status , Patient's Chart, lab work & pertinent test results  History of Anesthesia Complications (+) PONV and history of anesthetic complications  Airway Mallampati: II  TM Distance: >3 FB Neck ROM: Full    Dental  (+) Partial Lower, Partial Upper, Missing   Pulmonary PE (2013)   Pulmonary exam normal        Cardiovascular negative cardio ROS Normal cardiovascular exam     Neuro/Psych negative neurological ROS  negative psych ROS   GI/Hepatic Neg liver ROS, GERD  Controlled,Familial adenomatous polyposis   Endo/Other  negative endocrine ROS  Renal/GU negative Renal ROS  negative genitourinary   Musculoskeletal  (+) Arthritis ,   Abdominal   Peds  Hematology negative hematology ROS (+)   Anesthesia Other Findings Day of surgery medications reviewed with patient.  Reproductive/Obstetrics negative OB ROS                           Anesthesia Physical Anesthesia Plan  ASA: II  Anesthesia Plan: MAC   Post-op Pain Management:    Induction:   PONV Risk Score and Plan: 3 and Treatment may vary due to age or medical condition and Propofol infusion  Airway Management Planned: Natural Airway and Nasal Cannula  Additional Equipment: None  Intra-op Plan:   Post-operative Plan:   Informed Consent: I have reviewed the patients History and Physical, chart, labs and discussed the procedure including the risks, benefits and alternatives for the proposed anesthesia with the patient or authorized representative who has indicated his/her understanding and acceptance.       Plan Discussed with: CRNA  Anesthesia Plan Comments:        Anesthesia Quick Evaluation

## 2019-12-16 NOTE — Discharge Instructions (Addendum)
I removed the stent from the pancreas.  I did not see any polyps anywhere!  Let's plan on rechecking the remaining rectum late this year and consider a check for polyps in the upper intestine again also.   Last time the rectum was checked was 07/17/2019 so will aim for a similar time.  We do not need to do it at the hospital to the best of my knowledge.  ALSO COME FIRST WEEK OF April AND GET BLOOD COUNT AND IRON LEVELS CHECKED - TO [facility] [address]  I appreciate the opportunity to care for you. Gatha Mayer, MD, FACG  YOU HAD AN ENDOSCOPIC PROCEDURE TODAY: Refer to the procedure report and other information in the discharge instructions given to you for any specific questions about what was found during the examination. If this information does not answer your questions, please call Dr. Celesta Aver office at [phone] to clarify.   YOU SHOULD EXPECT: Some feelings of bloating in the abdomen. Passage of more gas than usual. Walking can help get rid of the air that was put into your GI tract during the procedure and reduce the bloating. If you had a lower endoscopy (such as a colonoscopy or flexible sigmoidoscopy) you may notice spotting of blood in your stool or on the toilet paper. Some abdominal soreness may be present for a day or two, also.  DIET: Your first meal following the procedure should be a light meal and then it is ok to progress to your normal diet. A half-sandwich or bowl of soup is an example of a good first meal. Heavy or fried foods are harder to digest and may make you feel nauseous or bloated. Drink plenty of fluids but you should avoid alcoholic beverages for 24 hours.   ACTIVITY: Your care partner should take you home directly after the procedure. You should plan to take it easy, moving slowly for the rest of the day. You can resume normal activity the day after the procedure however YOU SHOULD NOT DRIVE, use power tools, machinery or perform tasks that involve  climbing or major physical exertion for 24 hours (because of the sedation medicines used during the test).   SYMPTOMS TO REPORT IMMEDIATELY: A gastroenterologist can be reached at any hour. Please call [phone]  for any of the following symptoms:   Following upper endoscopy (EGD, EUS, ERCP, esophageal dilation) Vomiting of blood or coffee ground material  New, significant abdominal pain  New, significant chest pain or pain under the shoulder blades  Painful or persistently difficult swallowing  New shortness of breath  Black, tarry-looking or red, bloody stools

## 2019-12-16 NOTE — Op Note (Signed)
[facility] Patient Name: Emma Lutz Procedure Date: 12/16/2019 MRN: [id number] Attending MD: Gatha Mayer , MD Date of Birth: [birth year] CSN: [id number] Age: 64 Admit Type: Outpatient Procedure:                Small bowel enteroscopy Indications:              Follow-up of adenomatous polyps in the small bowel,                            Foreign body in the GI tract Providers:                Gatha Mayer, MD, Cleda Daub, RN, Cherylynn Ridges, Technician, Dayle Points, CRNA Referring MD:              Medicines:                Propofol per Anesthesia, Monitored Anesthesia Care Complications:            No immediate complications. Estimated Blood Loss:     Estimated blood loss: none. Procedure:                Pre-Anesthesia Assessment:                           - Prior to the procedure, a History and Physical                            was performed, and patient medications and                            allergies were reviewed. The patient's tolerance of                            previous anesthesia was also reviewed. The risks                            and benefits of the procedure and the sedation                            options and risks were discussed with the patient.                            All questions were answered, and informed consent                            was obtained. Prior Anticoagulants: The patient has                            taken no previous anticoagulant or antiplatelet                            agents. ASA Grade Assessment: II - A patient with  mild systemic disease. After reviewing the risks                            and benefits, the patient was deemed in                            satisfactory condition to undergo the procedure.                           After obtaining informed consent, the endoscope was                            passed under direct vision. Throughout  the                            procedure, the patient's blood pressure, pulse, and                            oxygen saturations were monitored continuously. The                            [id number]-[id number] ([id number]) Olympus pediatric colonscope                            was introduced through the mouth and advanced to                            the afferent and efferent jejunal loop. The small                            bowel enteroscopy was accomplished without                            difficulty. The patient tolerated the procedure                            well. Scope In: Scope Out: Findings:      The examined esophagus was normal.      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 I was able to       find the anastomosis whhere the limb containing the bilio-pancreatic       anastomoses were, identify the stent and remove the retained pancreatic       stent using a sneare.      No polyps were seen anywhere - normal post-gastrectomy duodenectomy       anatomy. Impression:               - Normal esophagus.                           - No specimens collected. Recommendation:           - The patient will be observed post-procedure,  until all discharge criteria are met.                           - Discharge patient to home.                           - Patient has a contact number available for                            emergencies. The signs and symptoms of potential                            delayed complications were discussed with the                            patient. Return to normal activities tomorrow.                            Written discharge instructions were provided to the                            patient.                           - Resume previous diet. Procedure Code(s):        --- Professional ---                           [phone], Small intestinal  endoscopy, enteroscopy                            beyond second portion of duodenum, not including                            ileum; with removal of foreign body(s) Diagnosis Code(s):        --- Professional ---                           D13.30, Benign neoplasm of unspecified part of                            small intestine                           T18.9XXA, Foreign body of alimentary tract, part                            unspecified, initial encounter CPT copyright 2019 [employer]. All rights reserved. The codes documented in this report are preliminary and upon coder review may  be revised to meet current compliance requirements. Gatha Mayer, MD 12/16/2019 12:58:26 PM This report has been signed electronically. Number of Addenda: 0

## 2019-12-16 NOTE — H&P (Signed)
[facility] History and Physical   Reason for Procedure:   familial adenomatous polyposis - f/u small bowel polyps and retrieve pancreatic stent  Plan:    entreroscopy     HPI: Emma Lutz is a 64 y.o. female with FAP a/p total gastrectomy and pancreas-sparing duodenectomy last year - due to gastric and duodenal polyposis and cancer risk. Here for a surveillance exam and removal of pancreatic stent placed at surgery.   Past Medical History:  Diagnosis Date  . Allergy   . Anemia yrs ago   no recent iron use  . Clotting disorder ([place]) 2012   post op blood clots in lungs  . Familial adenomatous polyposis    subtotal colectomy, proctectomy (5/12),  duodenal and gastric adenomas, followed by [facility])  . GERD (gastroesophageal reflux disease)   . Osteoarthritis   . Pancreatitis 2012  . PONV (postoperative nausea and vomiting) 2012   nausea after last surgery  . Pre-diabetes   . Pulmonary embolism ([place]) 02/2012   multiple  . Sessile rectal polyp   . Tubular adenoma    gastric, multiple  . Wears glasses   . Wears glasses   . Wears partial dentures     Past Surgical History:  Procedure Laterality Date  . APPENDECTOMY    . BIOPSY  06/25/2018   Procedure: BIOPSY;  Surgeon: Gatha Mayer, MD;  Location: [facility];  Service: Endoscopy;;  . BIOPSY  11/26/2018   Procedure: BIOPSY;  Surgeon: Irving Copas., MD;  Location: [place];  Service: Gastroenterology;;  . CARPAL TUNNEL RELEASE Left   . CHOLECYSTECTOMY  08/2019  . DUODENECTOMY  08/2019  . ELBOW SURGERY Right   . ENDOSCOPIC MUCOSAL RESECTION N/A 07/17/2019   Procedure: ENDOSCOPIC MUCOSAL RESECTION;  Surgeon: Rush Landmark Telford Nab., MD;  Location: [facility];  Service: Gastroenterology;  Laterality: N/A;  . ESOPHAGOGASTRODUODENOSCOPY    . ESOPHAGOGASTRODUODENOSCOPY N/A 10/16/2014   Procedure: ESOPHAGOGASTRODUODENOSCOPY (EGD);  Surgeon: Gatha Mayer, MD;  Location: [facility];   Service: Endoscopy;  Laterality: N/A;  need ercp scope  . ESOPHAGOGASTRODUODENOSCOPY N/A 12/24/2015   Procedure: ESOPHAGOGASTRODUODENOSCOPY (EGD);  Surgeon: Gatha Mayer, MD;  Location: [facility];  Service: Endoscopy;  Laterality: N/A;  May need ERCP scope  . ESOPHAGOGASTRODUODENOSCOPY (EGD) WITH PROPOFOL N/A 01/10/2017   Procedure: ESOPHAGOGASTRODUODENOSCOPY (EGD) WITH PROPOFOL;  Surgeon: Gatha Mayer, MD;  Location: [facility];  Service: Endoscopy;  Laterality: N/A;  . ESOPHAGOGASTRODUODENOSCOPY (EGD) WITH PROPOFOL N/A 06/25/2018   Procedure: ESOPHAGOGASTRODUODENOSCOPY (EGD) WITH PROPOFOL;  Surgeon: Gatha Mayer, MD;  Location: [facility];  Service: Endoscopy;  Laterality: N/A;  . ESOPHAGOGASTRODUODENOSCOPY (EGD) WITH PROPOFOL N/A 09/17/2018   Procedure: ESOPHAGOGASTRODUODENOSCOPY (EGD) WITH PROPOFOL;  Surgeon: Rush Landmark Telford Nab., MD;  Location: [place];  Service: Gastroenterology;  Laterality: N/A;  . ESOPHAGOGASTRODUODENOSCOPY (EGD) WITH PROPOFOL N/A 11/26/2018   Procedure: ESOPHAGOGASTRODUODENOSCOPY (EGD) WITH PROPOFOL;  Surgeon: Rush Landmark Telford Nab., MD;  Location: [place];  Service: Gastroenterology;  Laterality: N/A;  needs to be a 2 hour case  . ESOPHAGOGASTRODUODENOSCOPY (EGD) WITH PROPOFOL N/A 07/17/2019   Procedure: ESOPHAGOGASTRODUODENOSCOPY (EGD) WITH PROPOFOL;  Surgeon: Rush Landmark Telford Nab., MD;  Location: [facility];  Service: Gastroenterology;  Laterality: N/A;  . FLEXIBLE SIGMOIDOSCOPY    . FLEXIBLE SIGMOIDOSCOPY N/A 10/16/2014   Procedure: FLEXIBLE SIGMOIDOSCOPY;  Surgeon: Gatha Mayer, MD;  Location: [facility];  Service: Endoscopy;  Laterality: N/A;  . FLEXIBLE SIGMOIDOSCOPY N/A 12/24/2015   Procedure: FLEXIBLE SIGMOIDOSCOPY;  Surgeon: Gatha Mayer, MD;  Location: [facility];  Service: Endoscopy;  Laterality: N/A;  . FLEXIBLE SIGMOIDOSCOPY N/A 01/10/2017   Procedure: FLEXIBLE SIGMOIDOSCOPY;  Surgeon: Gatha Mayer, MD;  [facility];  Service: Endoscopy;  Laterality: N/A;  . FLEXIBLE SIGMOIDOSCOPY N/A 06/25/2018   Procedure: FLEXIBLE SIGMOIDOSCOPY;  Surgeon: Gatha Mayer, MD;  Location[facility];  Service: Endoscopy;  Laterality: N/A;  . FLEXIBLE SIGMOIDOSCOPY N/A 07/17/2019   Procedure: FLEXIBLE SIGMOIDOSCOPY;  Surgeon: Irving Copas., MD;  Location: [facility];  Service: Gastroenterology;  Laterality: N/A;  . FOREIGN BODY REMOVAL  07/17/2019   Procedure: FOREIGN BODY REMOVAL;  Surgeon: Rush Landmark Telford Nab., MD;  Location: [facility];  Service: Gastroenterology;;  . HEMOSTASIS CLIP PLACEMENT  07/17/2019   Procedure: HEMOSTASIS CLIP PLACEMENT;  Surgeon: Irving Copas., MD;  Location: [facility];  Service: Gastroenterology;;  . Laretta Alstrom CONTROL  07/17/2019   Procedure: HEMOSTASIS CONTROL;  Surgeon: Irving Copas., MD;  Location: [facility];  Service: Gastroenterology;;  EPI  . HERNIA REPAIR    . ILEOSTOMY CLOSURE  08/17/11   Dr. Brooke Pace  . LUMBAR SPINE SURGERY     X 2  . MULTIPLE TOOTH EXTRACTIONS    . POLYPECTOMY  11/26/2018   Procedure: POLYPECTOMY;  Surgeon: Mansouraty, Telford Nab., MD;  Location: [place];  Service: Gastroenterology;;  . POLYPECTOMY  07/17/2019   Procedure: POLYPECTOMY;  Surgeon: Irving Copas., MD;  Location: [facility];  Service: Gastroenterology;;  . PROCTECTOMY  02/16/11   with loop ileostomy [place]rt[place]  . ROTATOR CUFF REPAIR Right 1990  . SUBMUCOSAL LIFTING INJECTION  07/17/2019   Procedure: SUBMUCOSAL LIFTING INJECTION;  Surgeon: Rush Landmark Telford Nab., MD;  Location: [facility];  Service: Gastroenterology;;  . [place]   with ileostomy/ileo-rectal anastomosis  . TOTAL GASTRECTOMY  08/2019  . TUBAL LIGATION      Prior to Admission medications   Medication Sig Start Date End Date Taking? Authorizing Provider  Cholecalciferol (VITAMIN D) 50 MCG (2000 UT) CAPS Take 2,000 Units by mouth daily.    Yes  [provider]  Cyanocobalamin (VITAMIN B 12) 500 MCG TABS Take 1,000 capsules by mouth daily.    Yes [provider]  Multiple Vitamin (MULTIVITAMIN) tablet Take 1 tablet by mouth daily.   Yes [provider]  PARoxetine (PAXIL-CR) 25 MG 24 hr tablet Take 25 mg by mouth daily.  09/06/14  Yes [provider]  nystatin-triamcinolone (MYCOLOG II) cream Apply 1 application topically daily as needed (Skin Rash).  11/14/19   [provider]    Current Facility-Administered Medications  Medication Dose Route Frequency Provider Last Rate Last Admin  . 0.9 %  sodium chloride infusion   Intravenous Continuous Gatha Mayer, MD      . lactated ringers infusion   Intravenous Continuous Gatha Mayer, MD 125 mL/hr at 12/16/19 1132 1,000 mL at 12/16/19 1132    Allergies as of 11/26/2019 - Review Complete 11/11/2019  Allergen Reaction Noted  . Sulfonamide derivatives Rash 03/26/2010    Family History  Problem Relation Age of Onset  . Heart disease Mother   . Emphysema Mother   . Familial polyposis Father   . Esophageal cancer Father   . Colon cancer Father   . Diabetes Father   . Colon polyps Father   . Kidney disease Father   . Transient ischemic attack Father   . Brain cancer Sister   . Rectal cancer Neg Hx   . Stomach cancer Neg  Hx   . Inflammatory bowel disease Neg Hx   . Liver disease Neg Hx   . Pancreatic cancer Neg Hx     Social History   Social History Narrative   The patient is widowed she has 2 daughters and grandchildren that live in [place]   She works as a Hotel manager support person   2-3 caffeinated beverages daily     Review of Systems:  All other review of systems negative except as mentioned in the HPI.  Physical Exam: Vital signs in last 24 hours: Temp:  [97.9 F (36.6 C)] 97.9 F (36.6 C) (03/01 1055) Pulse Rate:  [75] 75 (03/01 1055) Resp:  [22] 22 (03/01 1055) BP: (124)/(73) 124/73 (03/01  1055) SpO2:  [98 %] 98 % (03/01 1055) Weight:  [78.9 kg] 78.9 kg (03/01 1055)   General:   Alert,  Well-developed, well-nourished, pleasant and cooperative in NAD Lungs:  Clear throughout to auscultation.   Heart:  Regular rate and rhythm; no murmurs, clicks, rubs,  or gallops. Abdomen:  Soft, nontender and nondistended. Normal bowel sounds.   Neuro/Psych:  Alert and cooperative. Normal mood and affect. A and O x 3   @Keimora Swartout  Simonne Maffucci, MD, [facility] [phone] (pager) 12/16/2019 11:41 AM@

## 2019-12-17 ENCOUNTER — Encounter: Payer: Self-pay | Admitting: *Deleted

## 2020-02-06 ENCOUNTER — Encounter: Payer: Self-pay | Admitting: Internal Medicine

## 2020-02-06 ENCOUNTER — Other Ambulatory Visit: Payer: Self-pay | Admitting: Internal Medicine

## 2020-02-06 ENCOUNTER — Other Ambulatory Visit (INDEPENDENT_AMBULATORY_CARE_PROVIDER_SITE_OTHER): Payer: BC Managed Care – PPO

## 2020-02-06 DIAGNOSIS — Z903 Acquired absence of stomach [part of]: Secondary | ICD-10-CM

## 2020-02-06 DIAGNOSIS — K912 Postsurgical malabsorption, not elsewhere classified: Secondary | ICD-10-CM | POA: Insufficient documentation

## 2020-02-06 DIAGNOSIS — D509 Iron deficiency anemia, unspecified: Secondary | ICD-10-CM

## 2020-02-06 HISTORY — DX: Acquired absence of stomach (part of): Z90.3

## 2020-02-06 LAB — CBC
HCT: 47.5 % — ABNORMAL HIGH (ref 36.0–46.0)
Hemoglobin: 15.9 g/dL — ABNORMAL HIGH (ref 12.0–15.0)
MCHC: 33.5 g/dL (ref 30.0–36.0)
MCV: 87.7 fl (ref 78.0–100.0)
Platelets: 144 10*3/uL — ABNORMAL LOW (ref 150.0–400.0)
RBC: 5.42 Mil/uL — ABNORMAL HIGH (ref 3.87–5.11)
RDW: 16.3 % — ABNORMAL HIGH (ref 11.5–15.5)
WBC: 4.9 10*3/uL (ref 4.0–10.5)

## 2020-02-06 LAB — VITAMIN B12: Vitamin B-12: 709 pg/mL (ref 211–911)

## 2020-02-06 LAB — FERRITIN: Ferritin: 133.8 ng/mL (ref 10.0–291.0)

## 2020-03-20 ENCOUNTER — Encounter: Payer: Self-pay | Admitting: Internal Medicine

## 2020-03-30 ENCOUNTER — Telehealth: Payer: Self-pay | Admitting: Internal Medicine

## 2020-03-30 NOTE — Telephone Encounter (Signed)
I cannot see where that came from - she can just do the labs.

## 2020-03-30 NOTE — Telephone Encounter (Signed)
See question from patient

## 2020-03-30 NOTE — Telephone Encounter (Signed)
Patient notified of Dr. Celesta Aver response

## 2020-04-30 ENCOUNTER — Other Ambulatory Visit (INDEPENDENT_AMBULATORY_CARE_PROVIDER_SITE_OTHER): Payer: BC Managed Care – PPO

## 2020-04-30 DIAGNOSIS — K912 Postsurgical malabsorption, not elsewhere classified: Secondary | ICD-10-CM | POA: Diagnosis not present

## 2020-04-30 DIAGNOSIS — Z903 Acquired absence of stomach [part of]: Secondary | ICD-10-CM | POA: Diagnosis not present

## 2020-04-30 LAB — CBC
HCT: 47.3 % — ABNORMAL HIGH (ref 36.0–46.0)
Hemoglobin: 15.9 g/dL — ABNORMAL HIGH (ref 12.0–15.0)
MCHC: 33.6 g/dL (ref 30.0–36.0)
MCV: 91.1 fl (ref 78.0–100.0)
Platelets: 162 10*3/uL (ref 150.0–400.0)
RBC: 5.19 Mil/uL — ABNORMAL HIGH (ref 3.87–5.11)
RDW: 13 % (ref 11.5–15.5)
WBC: 5.6 10*3/uL (ref 4.0–10.5)

## 2020-04-30 LAB — VITAMIN B12: Vitamin B-12: 396 pg/mL (ref 211–911)

## 2020-04-30 LAB — FERRITIN: Ferritin: 89 ng/mL (ref 10.0–291.0)

## 2020-05-05 ENCOUNTER — Other Ambulatory Visit: Payer: Self-pay | Admitting: Internal Medicine

## 2020-05-05 DIAGNOSIS — D509 Iron deficiency anemia, unspecified: Secondary | ICD-10-CM

## 2020-05-05 DIAGNOSIS — Z903 Acquired absence of stomach [part of]: Secondary | ICD-10-CM

## 2020-05-06 ENCOUNTER — Telehealth: Payer: Self-pay

## 2020-05-06 DIAGNOSIS — K912 Postsurgical malabsorption, not elsewhere classified: Secondary | ICD-10-CM

## 2020-05-06 DIAGNOSIS — Z903 Acquired absence of stomach [part of]: Secondary | ICD-10-CM

## 2020-05-06 NOTE — Telephone Encounter (Signed)
-----   Message from Ruby Cola, RD sent at 05/06/2020  7:29 AM EDT ----- Regarding: RE: referral to you Hello! According to my front office staff you put in NUT or [place], please let me know if this works. :) ----- Message ----- From: Marlon Pel, RN Sent: 05/05/2020   5:06 PM EDT To: Gatha Mayer, MD, Ruby Cola, RD Subject: RE: referral to you                            Happy to make the referral. Are you with [facility]?  What is your clinic called in [place]? ----- Message ----- From: Gatha Mayer, MD Sent: 05/05/2020  10:39 AM EDT To: Marlon Pel, RN, Ruby Cola, RD Subject: RE: referral to you                            Thank you very much - we will do this.  [facility] ----- Message ----- From: Ruby Cola, RD Sent: 05/04/2020   7:56 AM EDT To: Gatha Mayer, MD Subject: RE: referral to you                            Okay sounds good. Just send the referral over per usual, with attention Alexis on the referral sheet. Thank you. ----- Message ----- From: Gatha Mayer, MD Sent: 05/01/2020   1:47 PM EDT To: Ruby Cola, RD Subject: referral to you                                Julieanne Manson,  I would like you to see Ms. Liberatore - a patient with extensive GI surgery - colectomy, proctectomy and now total gastrectomy with duodenectomy. 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  I thought you would be a good choice as her upper GI surgery is similar to a gastric bypass (I guess).  How would I direct her to you, specifically?  Thanks  Silvano Rusk

## 2020-05-06 NOTE — Telephone Encounter (Signed)
Referral entered.  Patient will be contacted directly with an appt date and time by nutrition.

## 2020-07-08 ENCOUNTER — Other Ambulatory Visit (INDEPENDENT_AMBULATORY_CARE_PROVIDER_SITE_OTHER): Payer: Medicare Other

## 2020-07-08 ENCOUNTER — Other Ambulatory Visit: Payer: Self-pay

## 2020-07-08 DIAGNOSIS — D126 Benign neoplasm of colon, unspecified: Secondary | ICD-10-CM

## 2020-07-08 DIAGNOSIS — D132 Benign neoplasm of duodenum: Secondary | ICD-10-CM

## 2020-07-08 DIAGNOSIS — Z903 Acquired absence of stomach [part of]: Secondary | ICD-10-CM

## 2020-07-08 DIAGNOSIS — D509 Iron deficiency anemia, unspecified: Secondary | ICD-10-CM | POA: Diagnosis not present

## 2020-07-08 DIAGNOSIS — K912 Postsurgical malabsorption, not elsewhere classified: Secondary | ICD-10-CM | POA: Diagnosis not present

## 2020-07-08 LAB — COMPREHENSIVE METABOLIC PANEL
ALT: 13 U/L (ref 0–35)
AST: 20 U/L (ref 0–37)
Albumin: 4.2 g/dL (ref 3.5–5.2)
Alkaline Phosphatase: 86 U/L (ref 39–117)
BUN: 8 mg/dL (ref 6–23)
CO2: 30 mEq/L (ref 19–32)
Calcium: 9.6 mg/dL (ref 8.4–10.5)
Chloride: 103 mEq/L (ref 96–112)
Creatinine, Ser: 0.65 mg/dL (ref 0.40–1.20)
GFR: 91.43 mL/min (ref >60.00)
Glucose, Bld: 94 mg/dL (ref 70–99)
Potassium: 4 mEq/L (ref 3.5–5.1)
Sodium: 139 mEq/L (ref 135–145)
Total Bilirubin: 1.1 mg/dL (ref 0.2–1.2)
Total Protein: 6.6 g/dL (ref 6.0–8.3)

## 2020-07-08 LAB — CBC
HCT: 47.5 % — ABNORMAL HIGH (ref 36.0–46.0)
Hemoglobin: 15.8 g/dL — ABNORMAL HIGH (ref 12.0–15.0)
MCHC: 33.2 g/dL (ref 30.0–36.0)
MCV: 89.8 fl (ref 78.0–100.0)
Platelets: 146 10*3/uL — ABNORMAL LOW (ref 150.0–400.0)
RBC: 5.29 Mil/uL — ABNORMAL HIGH (ref 3.87–5.11)
RDW: 13.1 % (ref 11.5–15.5)
WBC: 5.9 10*3/uL (ref 4.0–10.5)

## 2020-07-08 LAB — VITAMIN B12: Vitamin B-12: 683 pg/mL (ref 211–911)

## 2020-07-08 LAB — FERRITIN: Ferritin: 87.8 ng/mL (ref 10.0–291.0)

## 2020-07-13 ENCOUNTER — Other Ambulatory Visit: Payer: Self-pay | Admitting: Internal Medicine

## 2020-07-13 DIAGNOSIS — D696 Thrombocytopenia, unspecified: Secondary | ICD-10-CM

## 2020-08-31 ENCOUNTER — Telehealth: Payer: Self-pay | Admitting: Internal Medicine

## 2020-08-31 NOTE — Telephone Encounter (Signed)
I am happy to write a letter to see what we can do for her.  It is legitimate in my mind.  I need a copy of the letter or the information that says which jury pool etc. so I can put the numbers from the form into my letter.

## 2020-08-31 NOTE — Telephone Encounter (Signed)
Patient called states she was summoned for [employer] and is requesting a letter from Dr. Carlean Purl advising them of her condition and having to eat and drink every two hours please advise

## 2020-08-31 NOTE — Telephone Encounter (Signed)
Are you able to write her a jury letter?

## 2020-08-31 NOTE — Telephone Encounter (Signed)
Patient advised to call back with juror number, address to address, and date of summons.  She is not home, she understands to call back with the information.

## 2020-09-01 ENCOUNTER — Encounter: Payer: Self-pay | Admitting: Internal Medicine

## 2020-09-01 ENCOUNTER — Telehealth: Payer: Self-pay | Admitting: Internal Medicine

## 2020-09-01 NOTE — Telephone Encounter (Signed)
Please advise Sir, thank you. 

## 2020-09-01 NOTE — Telephone Encounter (Signed)
Done I sent a My Chart message to her

## 2020-09-02 NOTE — Telephone Encounter (Signed)
Left message for patient to call back  

## 2020-09-14 NOTE — Telephone Encounter (Signed)
Inbound call from patient stating that she already received letter from Dr. Carlean Purl via my chart.

## 2020-09-14 NOTE — Telephone Encounter (Signed)
I left another message for the patient to call back with the date of summons, her juror number, and address where letter needs to go if she still needs Dr. Carlean Purl to write the letter.

## 2020-09-21 ENCOUNTER — Other Ambulatory Visit (INDEPENDENT_AMBULATORY_CARE_PROVIDER_SITE_OTHER): Payer: Medicare Other

## 2020-09-21 DIAGNOSIS — D696 Thrombocytopenia, unspecified: Secondary | ICD-10-CM

## 2020-09-21 LAB — CBC WITH DIFFERENTIAL/PLATELET
Basophils Absolute: 0 10*3/uL (ref 0.0–0.1)
Basophils Relative: 0.6 % (ref 0.0–3.0)
Eosinophils Absolute: 0.1 10*3/uL (ref 0.0–0.7)
Eosinophils Relative: 2.8 % (ref 0.0–5.0)
HCT: 47.2 % — ABNORMAL HIGH (ref 36.0–46.0)
Hemoglobin: 15.6 g/dL — ABNORMAL HIGH (ref 12.0–15.0)
Lymphocytes Relative: 41.9 % (ref 12.0–46.0)
Lymphs Abs: 2 10*3/uL (ref 0.7–4.0)
MCHC: 33 g/dL (ref 30.0–36.0)
MCV: 89.5 fl (ref 78.0–100.0)
Monocytes Absolute: 0.5 10*3/uL (ref 0.1–1.0)
Monocytes Relative: 10.1 % (ref 3.0–12.0)
Neutro Abs: 2.1 10*3/uL (ref 1.4–7.7)
Neutrophils Relative %: 44.6 % (ref 43.0–77.0)
Platelets: 147 10*3/uL — ABNORMAL LOW (ref 150.0–400.0)
RBC: 5.27 Mil/uL — ABNORMAL HIGH (ref 3.87–5.11)
RDW: 13.2 % (ref 11.5–15.5)
WBC: 4.8 10*3/uL (ref 4.0–10.5)

## 2020-09-22 LAB — PATHOLOGIST SMEAR REVIEW

## 2020-09-24 ENCOUNTER — Other Ambulatory Visit: Payer: Self-pay

## 2020-09-24 DIAGNOSIS — D696 Thrombocytopenia, unspecified: Secondary | ICD-10-CM

## 2020-09-24 NOTE — Progress Notes (Signed)
amb re 

## 2020-10-02 ENCOUNTER — Telehealth: Payer: Self-pay | Admitting: Hematology and Oncology

## 2020-10-02 NOTE — Telephone Encounter (Signed)
Received a new hem referral from Dr. Carlean Purl for thrombocytopenia. Emma Lutz has been cld and scheduled to see Dr. Chryl Heck on 1/6 at 1pm. Pt aware toa rrive 30 minuets early.

## 2020-10-07 ENCOUNTER — Telehealth: Payer: Self-pay | Admitting: Hematology and Oncology

## 2020-10-07 NOTE — Telephone Encounter (Signed)
Received a call from Emma Lutz to reschedule her 1/6 appt to and earlier time. Appt has been rescheduled to 9am.

## 2020-10-21 ENCOUNTER — Telehealth: Payer: Self-pay | Admitting: Hematology and Oncology

## 2020-10-21 NOTE — Telephone Encounter (Signed)
Appointment was rescheduled to a phone visit per MD. Called patient, no answer. Left message for patient letting her know appointment was changed to a phone visit. I also left instructions for patient to call back to confirm this change.

## 2020-10-22 ENCOUNTER — Encounter: Payer: Medicare Other | Admitting: Hematology and Oncology

## 2020-10-22 ENCOUNTER — Inpatient Hospital Stay: Payer: Medicare Other

## 2020-10-22 ENCOUNTER — Inpatient Hospital Stay: Payer: Medicare Other | Admitting: Hematology and Oncology

## 2020-10-22 ENCOUNTER — Other Ambulatory Visit: Payer: Medicare Other

## 2020-10-30 ENCOUNTER — Inpatient Hospital Stay: Payer: Medicare Other | Attending: Hematology and Oncology | Admitting: Hematology and Oncology

## 2020-10-30 ENCOUNTER — Telehealth: Payer: Self-pay

## 2020-10-30 ENCOUNTER — Other Ambulatory Visit: Payer: Self-pay

## 2020-10-30 ENCOUNTER — Inpatient Hospital Stay: Payer: Medicare Other

## 2020-10-30 ENCOUNTER — Encounter: Payer: Self-pay | Admitting: Hematology and Oncology

## 2020-10-30 VITALS — BP 127/68 | HR 91 | Temp 97.8°F | Resp 18 | Ht 62.0 in | Wt 160.0 lb

## 2020-10-30 DIAGNOSIS — D751 Secondary polycythemia: Secondary | ICD-10-CM | POA: Diagnosis not present

## 2020-10-30 DIAGNOSIS — Z79899 Other long term (current) drug therapy: Secondary | ICD-10-CM | POA: Insufficient documentation

## 2020-10-30 DIAGNOSIS — Z86711 Personal history of pulmonary embolism: Secondary | ICD-10-CM | POA: Diagnosis not present

## 2020-10-30 DIAGNOSIS — D696 Thrombocytopenia, unspecified: Secondary | ICD-10-CM

## 2020-10-30 LAB — CBC WITH DIFFERENTIAL/PLATELET
Abs Immature Granulocytes: 0.01 10*3/uL (ref 0.00–0.07)
Basophils Absolute: 0.1 10*3/uL (ref 0.0–0.1)
Basophils Relative: 1 %
Eosinophils Absolute: 0.2 10*3/uL (ref 0.0–0.5)
Eosinophils Relative: 3 %
HCT: 46.8 % — ABNORMAL HIGH (ref 36.0–46.0)
Hemoglobin: 15.1 g/dL — ABNORMAL HIGH (ref 12.0–15.0)
Immature Granulocytes: 0 %
Lymphocytes Relative: 35 %
Lymphs Abs: 2.1 10*3/uL (ref 0.7–4.0)
MCH: 29.4 pg (ref 26.0–34.0)
MCHC: 32.3 g/dL (ref 30.0–36.0)
MCV: 91.2 fL (ref 80.0–100.0)
Monocytes Absolute: 0.7 10*3/uL (ref 0.1–1.0)
Monocytes Relative: 11 %
Neutro Abs: 3 10*3/uL (ref 1.7–7.7)
Neutrophils Relative %: 50 %
Platelets: 151 10*3/uL (ref 150–400)
RBC: 5.13 MIL/uL — ABNORMAL HIGH (ref 3.87–5.11)
RDW: 12.3 % (ref 11.5–15.5)
WBC: 5.9 10*3/uL (ref 4.0–10.5)
nRBC: 0 % (ref 0.0–0.2)

## 2020-10-30 LAB — SAVE SMEAR(SSMR), FOR PROVIDER SLIDE REVIEW

## 2020-10-30 MED ORDER — VITAMIN B 12 500 MCG PO TABS: 1000.0000 | ORAL_TABLET | Freq: Every day | ORAL | 11 refills | Status: DC

## 2020-10-30 MED ORDER — VITAMIN D3 50 MCG (2000 UT) PO TABS: 75.0000 ug | ORAL_TABLET | Freq: Every day | ORAL | 11 refills | Status: DC

## 2020-10-30 NOTE — Progress Notes (Signed)
Emma Lutz CONSULT NOTE  No care team member to display  CHIEF COMPLAINTS/PURPOSE OF CONSULTATION:  Thrombocytopenia.  ASSESSMENT & PLAN:  No problem-specific Assessment & Plan notes found for this encounter.  No orders of the defined types were placed in this encounter.  This is a very pleasant 65 year old female patient with past medical history significant for FAP status post total gastrectomy, proctocolectomy, provoked PE several years ago referred to hematology for evaluation of mild thrombocytopenia.  Ms. Emma Lutz arrived to the appointment today by herself.  She denies any complaints for me except for baseline gastrointestinal issues.  She denies any unusual bruising or bleeding. Physical examination quite unremarkable. With regards to thrombocytopenia, reviewed labs for the past few years, onset about 8 months ago, mild and stable no anemia or leukopenia.  Most recent labs are normal.  I recommended CBC, smear review and folic acid levels today.  Given very mild thrombocytopenia since she is completely asymptomatic, I feel comfortable with monitoring.  She is also agreeable to this plan. She will return to clinic in about 4 months with repeat labs.  2.  Polycythemia, mild and chronic We will continue to monitor it.  Thank you for consulting [place] in the care of this patient.  Please do not hesitate to contact us with any additional questions  HISTORY OF PRESENTING ILLNESS:   Emma Lutz 65 y.o. female is here because of thrombocytopenia.  This is a very pleasant 65 yr old female patient with PMH for FAP, provoked PE, pancreatitis referred to hematology for thrombocytopenia. Emma Lutz is a very pleasant 65 year old female who arrived to the appointment today by herself.  She absolutely denies any new health complaints.  She does have a known diagnosis of FAP and had total gastrectomy last year, had subtotal colectomy and proctectomy a few years ago and has been  following up with gastroenterology every year. She denies any unusual bruising or bleeding.  No fevers, drenching night sweats, unintentional loss of weight, she has lost a lot of weight after her surgery because of lack of appetite and not being able to eat a full meal.  This has not however changed in the past few months. She denies any change in bowel habits or urinary habits. No new neurological complaints. Rest of the pertinent 10 point ROS reviewed and negative.  REVIEW OF SYSTEMS:   Constitutional: Denies fevers, chills or abnormal night sweats Eyes: Denies blurriness of vision, double vision or watery eyes Ears, nose, mouth, throat, and face: Denies mucositis or sore throat Respiratory: Denies cough, dyspnea or wheezes Cardiovascular: Denies palpitation, chest discomfort or lower extremity swelling Gastrointestinal:  Baseline gastrointestinal issues. Skin: Denies abnormal skin rashes Lymphatics: Denies new lymphadenopathy or easy bruising Neurological:Denies numbness, tingling or new weaknesses Behavioral/Psych: Mood is stable, no new changes  All other systems were reviewed with the patient and are negative.  MEDICAL HISTORY:  Past Medical History:  Diagnosis Date  . Allergy   . Anemia yrs ago   no recent iron use  . Clotting disorder ([place]) 2012   post op blood clots in lungs  . 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 Familial adenomatous polyposis    subtotal colectomy, proctectomy (5/12),  duodenal and gastric adenomas, followed by [facility] ([place])  . 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 Gastric polyps 09/23/2014  . GERD (gastroesophageal reflux disease)   . History of gastrectomy 02/06/2020  . Osteoarthritis   . Pancreatitis 2012  . PONV (postoperative nausea and vomiting) 2012  nausea after last surgery  . Pre-diabetes   . Pulmonary embolism ([place]) 02/2012   multiple  . Sessile rectal polyp   . Tubular adenoma    gastric, multiple  . Wears glasses   . Wears glasses   . Wears  partial dentures     SURGICAL HISTORY: Past Surgical History:  Procedure Laterality Date  . APPENDECTOMY    . BIOPSY  06/25/2018   Procedure: BIOPSY;  Surgeon: Gatha Mayer, MD;  Location: [facility];  Service: Endoscopy;;  . BIOPSY  11/26/2018   Procedure: BIOPSY;  Surgeon: Irving Copas., MD;  Location: [facility];  Service: Gastroenterology;;  . CARPAL TUNNEL RELEASE Left   . CHOLECYSTECTOMY  08/2019  . DUODENECTOMY  08/2019  . ELBOW SURGERY Right   . ENDOSCOPIC MUCOSAL RESECTION N/A 07/17/2019   Procedure: ENDOSCOPIC MUCOSAL RESECTION;  Surgeon: Rush Landmark Telford Nab., MD;  Location: [facility];  Service: Gastroenterology;  Laterality: N/A;  . ENTEROSCOPY N/A 12/16/2019   Procedure: ENTEROSCOPY;  Surgeon: Gatha Mayer, MD;  Location: [facility];  Service: Endoscopy;  Laterality: N/A;  ENTEROSCOPY WITH STENT REMOVAL   . ESOPHAGOGASTRODUODENOSCOPY    . ESOPHAGOGASTRODUODENOSCOPY N/A 10/16/2014   Procedure: ESOPHAGOGASTRODUODENOSCOPY (EGD);  Surgeon: Gatha Mayer, MD;  Location: [facility];  Service: Endoscopy;  Laterality: N/A;  need ercp scope  . ESOPHAGOGASTRODUODENOSCOPY N/A 12/24/2015   Procedure: ESOPHAGOGASTRODUODENOSCOPY (EGD);  Surgeon: Gatha Mayer, MD;  Location: [facility];  Service: Endoscopy;  Laterality: N/A;  May need ERCP scope  . ESOPHAGOGASTRODUODENOSCOPY (EGD) WITH PROPOFOL N/A 01/10/2017   Procedure: ESOPHAGOGASTRODUODENOSCOPY (EGD) WITH PROPOFOL;  Surgeon: Gatha Mayer, MD;  Location: [facility];  Service: Endoscopy;  Laterality: N/A;  . ESOPHAGOGASTRODUODENOSCOPY (EGD) WITH PROPOFOL N/A 06/25/2018   Procedure: ESOPHAGOGASTRODUODENOSCOPY (EGD) WITH PROPOFOL;  Surgeon: Gatha Mayer, MD;  Location: [facility];  Service: Endoscopy;  Laterality: N/A;  . ESOPHAGOGASTRODUODENOSCOPY (EGD) WITH PROPOFOL N/A 09/17/2018   Procedure: ESOPHAGOGASTRODUODENOSCOPY (EGD) WITH PROPOFOL;  Surgeon: Rush Landmark Telford Nab., MD;  Location: [facility];   Service: Gastroenterology;  Laterality: N/A;  . ESOPHAGOGASTRODUODENOSCOPY (EGD) WITH PROPOFOL N/A 11/26/2018   Procedure: ESOPHAGOGASTRODUODENOSCOPY (EGD) WITH PROPOFOL;  Surgeon: Rush Landmark Telford Nab., MD;  Location: [facility];  Service: Gastroenterology;  Laterality: N/A;  needs to be a 2 hour case  . ESOPHAGOGASTRODUODENOSCOPY (EGD) WITH PROPOFOL N/A 07/17/2019   Procedure: ESOPHAGOGASTRODUODENOSCOPY (EGD) WITH PROPOFOL;  Surgeon: Rush Landmark Telford Nab., MD;  [facility]: [facility];  Service: Gastroenterology;  Laterality: N/A;  . FLEXIBLE SIGMOIDOSCOPY    . FLEXIBLE SIGMOIDOSCOPY N/A 10/16/2014   Procedure: FLEXIBLE SIGMOIDOSCOPY;  Surgeon: Gatha Mayer, MD;  [facility]: [facility];  Service: Endoscopy;  Laterality: N/A;  . FLEXIBLE SIGMOIDOSCOPY N/A 12/24/2015   Procedure: FLEXIBLE SIGMOIDOSCOPY;  Surgeon: Gatha Mayer, MD;  Location: [facility];  Service: Endoscopy;  Laterality: N/A;  . FLEXIBLE SIGMOIDOSCOPY N/A 01/10/2017   Procedure: FLEXIBLE SIGMOIDOSCOPY;  Surgeon: Gatha Mayer, MD;  [facility];  Service: Endoscopy;  Laterality: N/A;  . FLEXIBLE SIGMOIDOSCOPY N/A 06/25/2018   Procedure: FLEXIBLE SIGMOIDOSCOPY;  Surgeon: Gatha Mayer, MD;  [facility];  Service: Endoscopy;  Laterality: N/A;  . FLEXIBLE SIGMOIDOSCOPY N/A 07/17/2019   Procedure: FLEXIBLE SIGMOIDOSCOPY;  Surgeon: Irving Copas., MD;  Location: [facility];  Service: Gastroenterology;  Laterality: N/A;  . FOREIGN BODY REMOVAL  07/17/2019   Procedure: FOREIGN BODY REMOVAL;  Surgeon: Rush Landmark Telford Nab., MD;  Location: [facility];  Service: Gastroenterology;;  . GASTROINTESTINAL STENT REMOVAL  12/16/2019   Procedure:  GASTROINTESTINAL STENT REMOVAL;  Surgeon: Gatha Mayer, MD;  Location: [facility];  Service: Endoscopy;;  . HEMOSTASIS CLIP PLACEMENT  07/17/2019   Procedure: HEMOSTASIS CLIP PLACEMENT;  Surgeon: Irving Copas., MD;  Location: [facility];  Service:  Gastroenterology;;  . HEMOSTASIS CONTROL  07/17/2019   Procedure: HEMOSTASIS CONTROL;  Surgeon: Irving Copas., MD;  Location: [facility];  Service: Gastroenterology;;  EPI  . HERNIA REPAIR    . ILEOSTOMY CLOSURE  08/17/11   Dr. Brooke Pace  . LUMBAR SPINE SURGERY     X 2  . MULTIPLE TOOTH EXTRACTIONS    . POLYPECTOMY  11/26/2018   Procedure: POLYPECTOMY;  Surgeon: Mansouraty, Telford Nab., MD;  Location: [facility];  Service: Gastroenterology;;  . POLYPECTOMY  07/17/2019   Procedure: POLYPECTOMY;  Surgeon: Irving Copas., MD;  Location: [facility];  Service: Gastroenterology;;  . PROCTECTOMY  02/16/11   with loop ileostomy Drt. Emma Lutz  . ROTATOR CUFF REPAIR Right 1990  . SUBMUCOSAL LIFTING INJECTION  07/17/2019   Procedure: SUBMUCOSAL LIFTING INJECTION;  Surgeon: Rush Landmark Telford Nab., MD;  Location: [facility];  Service: Gastroenterology;;  . [place]   with ileostomy/ileo-rectal anastomosis  . TOTAL GASTRECTOMY  08/2019  . TUBAL LIGATION      SOCIAL HISTORY: Social History   Socioeconomic History  . Marital status: Married    Spouse name: Not on file  . Number of children: 2  . Years of education: Not on file  . Highest education level: Not on file  Occupational History  . Occupation: employed    Fish farm manager: Office manager  . Occupation: Probation officer  Tobacco Use  . Smoking status: Never Smoker  . Smokeless tobacco: Never Used  Vaping Use  . Vaping Use: Never used  Substance and Sexual Activity  . Alcohol use: No    Alcohol/week: 0.0 standard drinks  . Drug use: No  . Sexual activity: Not on file  Other Topics Concern  . Not on file  Social History Narrative   The patient is widowed she has 2 daughters and grandchildren that live in [place]   She works as a Hotel manager support person   2-3 caffeinated beverages daily   Social Determinants of Radio broadcast assistant Strain: Not on file  Food  Insecurity: Not on file  Transportation Needs: Not on file  Physical Activity: Not on file  Stress: Not on file  Social Connections: Not on file  Intimate Partner Violence: Not on file    FAMILY HISTORY: Family History  Problem Relation Age of Onset  . Heart disease Mother   . Emphysema Mother   . Familial polyposis Father   . Esophageal cancer Father   . Colon cancer Father   . Diabetes Father   . Colon polyps Father   . Kidney disease Father   . Transient ischemic attack Father   . Brain cancer Sister   . Rectal cancer Neg Hx   . Stomach cancer Neg Hx   . Inflammatory bowel disease Neg Hx   . Liver disease Neg Hx   . Pancreatic cancer Neg Hx     ALLERGIES:  is allergic to sulfonamide derivatives.  MEDICATIONS:  Current Outpatient Medications  Medication Sig Dispense Refill  . Cholecalciferol (VITAMIN D) 50 MCG (2000 UT) CAPS Take 2,000 Units by mouth daily.     . Cyanocobalamin (VITAMIN B 12) 500 MCG TABS Take 1,000 capsules by mouth daily.     Marland Kitchen  Multiple Vitamin (MULTIVITAMIN) tablet Take 1 tablet by mouth daily.    Marland Kitchen nystatin-triamcinolone (MYCOLOG II) cream Apply 1 application topically daily as needed (Skin Rash).     Marland Kitchen PARoxetine (PAXIL-CR) 25 MG 24 hr tablet Take 25 mg by mouth daily.      No current facility-administered medications for this visit.     PHYSICAL EXAMINATION:  ECOG PERFORMANCE STATUS: 0 - Asymptomatic  Vitals:   10/30/20 1306  BP: 127/68  Pulse: 91  Resp: 18  Temp: 97.8 F (36.6 C)  SpO2: 98%   Filed Weights   10/30/20 1306  Weight: 160 lb (72.6 kg)   GENERAL:alert, no distress and comfortable. SKIN: skin color, texture, turgor are normal, no rashes or significant lesions EYES: normal, conjunctiva are pink and non-injected, sclera clear OROPHARYNX:no exudate, no erythema and lips, buccal mucosa, and tongue normal  NECK: supple, thyroid normal size, non-tender, without nodularity LYMPH:  no palpable lymphadenopathy in the  cervical, axillary or inguinal LUNGS: clear to auscultation and percussion with normal breathing effort HEART: regular rate & rhythm and no murmurs and no lower extremity edema ABDOMEN:abdomen soft, non-tender and normal bowel sounds. No organomegaly Musculoskeletal:no cyanosis of digits and no clubbing  PSYCH: alert & oriented x 3 with fluent speech NEURO: no focal motor/sensory deficits  LABORATORY DATA:  I have reviewed the data as listed Lab Results  Component Value Date   WBC 4.8 09/21/2020   HGB 15.6 (H) 09/21/2020   HCT 47.2 (H) 09/21/2020   MCV 89.5 09/21/2020   PLT 147.0 (L) 09/21/2020     Chemistry      Component Value Date/Time   NA 139 07/08/2020 1202   K 4.0 07/08/2020 1202   CL 103 07/08/2020 1202   CO2 30 07/08/2020 1202   BUN 8 07/08/2020 1202   CREATININE 0.65 07/08/2020 1202      Component Value Date/Time   CALCIUM 9.6 07/08/2020 1202   ALKPHOS 86 07/08/2020 1202   AST 20 07/08/2020 1202   ALT 13 07/08/2020 1202   BILITOT 1.1 07/08/2020 1202     Reviewed her labs, mild thrombocytopenia for the past several months.  RADIOGRAPHIC STUDIES: I have personally reviewed the radiological images as listed and agreed with the findings in the report.  No results found.  All questions were answered. The patient knows to call the clinic with any problems, questions or concerns. I spent 30 minutes in the care of this patient including H and P, review of records, documentation, counseling and coordination of care.     Benay Pike, MD 10/30/2020 1:24 PM

## 2020-10-30 NOTE — Telephone Encounter (Signed)
Refill x 1 year 

## 2020-10-30 NOTE — Telephone Encounter (Signed)
[place] sent a request to [place] for patient's Vitamin B12 1044mcg tablets one daily and also for her Vitamin D3 2000 IU one and a half tablets daily. She had recent lab work . Would you like me to refill these OTC products for her Sir?

## 2020-10-30 NOTE — Telephone Encounter (Signed)
Refills sent in

## 2020-11-01 LAB — FOLATE RBC
Folate, Hemolysate: 374 ng/mL
Folate, RBC: 804 ng/mL (ref >498)
Hematocrit: 46.5 % (ref 34.0–46.6)

## 2021-02-04 ENCOUNTER — Telehealth: Payer: Self-pay | Admitting: Internal Medicine

## 2021-02-04 MED ORDER — VITAMIN B 12 500 MCG PO TABS: 1000.0000 | ORAL_TABLET | Freq: Every day | ORAL | 11 refills | Status: AC

## 2021-02-04 NOTE — Telephone Encounter (Signed)
Left voicemail for patient that B12 has been sent in.

## 2021-03-02 ENCOUNTER — Inpatient Hospital Stay: Payer: Medicare Other

## 2021-03-02 ENCOUNTER — Other Ambulatory Visit: Payer: Self-pay

## 2021-03-02 ENCOUNTER — Encounter: Payer: Self-pay | Admitting: Hematology and Oncology

## 2021-03-02 ENCOUNTER — Telehealth: Payer: Self-pay | Admitting: Hematology and Oncology

## 2021-03-02 ENCOUNTER — Inpatient Hospital Stay: Payer: Medicare Other | Attending: Hematology and Oncology | Admitting: Hematology and Oncology

## 2021-03-02 VITALS — BP 116/71 | HR 75 | Temp 97.9°F | Resp 16 | Ht 62.0 in | Wt 161.4 lb

## 2021-03-02 DIAGNOSIS — M1990 Unspecified osteoarthritis, unspecified site: Secondary | ICD-10-CM | POA: Diagnosis not present

## 2021-03-02 DIAGNOSIS — D126 Benign neoplasm of colon, unspecified: Secondary | ICD-10-CM | POA: Diagnosis not present

## 2021-03-02 DIAGNOSIS — Z79899 Other long term (current) drug therapy: Secondary | ICD-10-CM | POA: Insufficient documentation

## 2021-03-02 DIAGNOSIS — D696 Thrombocytopenia, unspecified: Secondary | ICD-10-CM | POA: Diagnosis present

## 2021-03-02 DIAGNOSIS — K219 Gastro-esophageal reflux disease without esophagitis: Secondary | ICD-10-CM | POA: Diagnosis not present

## 2021-03-02 DIAGNOSIS — Z86711 Personal history of pulmonary embolism: Secondary | ICD-10-CM | POA: Diagnosis not present

## 2021-03-02 LAB — CBC WITH DIFFERENTIAL/PLATELET
Abs Immature Granulocytes: 0.02 10*3/uL (ref 0.00–0.07)
Basophils Absolute: 0 10*3/uL (ref 0.0–0.1)
Basophils Relative: 1 %
Eosinophils Absolute: 0.1 10*3/uL (ref 0.0–0.5)
Eosinophils Relative: 2 %
HCT: 45.7 % (ref 36.0–46.0)
Hemoglobin: 14.9 g/dL (ref 12.0–15.0)
Immature Granulocytes: 0 %
Lymphocytes Relative: 39 %
Lymphs Abs: 2.1 10*3/uL (ref 0.7–4.0)
MCH: 29.3 pg (ref 26.0–34.0)
MCHC: 32.6 g/dL (ref 30.0–36.0)
MCV: 90 fL (ref 80.0–100.0)
Monocytes Absolute: 0.6 10*3/uL (ref 0.1–1.0)
Monocytes Relative: 11 %
Neutro Abs: 2.6 10*3/uL (ref 1.7–7.7)
Neutrophils Relative %: 47 %
Platelets: 162 10*3/uL (ref 150–400)
RBC: 5.08 MIL/uL (ref 3.87–5.11)
RDW: 13 % (ref 11.5–15.5)
WBC: 5.5 10*3/uL (ref 4.0–10.5)
nRBC: 0 % (ref 0.0–0.2)

## 2021-03-02 NOTE — Assessment & Plan Note (Signed)
This is a very pleasant 65 year old female patient who was referred to hematology for evaluation of thrombocytopenia.  She has past medical history significant for FAP status post total gastrectomy, proctocolectomy and follows up with gastroenterology regularly. Since her last visit, she denies any new health complaints. No concern for gum bleeding, epistaxis, petechial rash. Physical examination without any concerns. During her last visit, given her very borderline thrombocytopenia, no clear evidence of nutritional deficiency or hemolysis, we have recommended follow-up when she was agreeable to this.  We will repeat CBC today.   If CBC shows mild thrombocytopenia, we can follow-up with her in a year.  She was recommended to reach out to [place] with any new concerns. Thank you for consulting [place] in the care of this patient.  Please not hesitate to contact us with any additional questions or concerns.  She will Age-appropriate cancer screening recommended.

## 2021-03-02 NOTE — Telephone Encounter (Signed)
Scheduled follow-up appointments per 5/17 los. Patient is aware. 

## 2021-03-02 NOTE — Progress Notes (Signed)
Emma Lutz FOLLOW UP NOTE  Patient Care Team: Pcp, No as PCP - General  CHIEF COMPLAINTS/PURPOSE OF CONSULTATION:  Thrombocytopenia, follow-up  ASSESSMENT & PLAN:  Thrombocytopenia ([place]) This is a very pleasant 65 year old female patient who was referred to hematology for evaluation of thrombocytopenia.  She has past medical history significant for FAP status post total gastrectomy, proctocolectomy and follows up with gastroenterology regularly. Since her last visit, she denies any new health complaints. No concern for gum bleeding, epistaxis, petechial rash. Physical examination without any concerns. During her last visit, given her very borderline thrombocytopenia, no clear evidence of nutritional deficiency or hemolysis, we have recommended follow-up when she was agreeable to this.  We will repeat CBC today.   If CBC shows mild thrombocytopenia, we can follow-up with her in a year.  She was recommended to reach out to [place] with any new concerns. Thank you for consulting [place] in the care of this patient.  Please not hesitate to contact us with any additional questions or concerns.  She will Age-appropriate cancer screening recommended.  Orders Placed This Encounter  Procedures  . CBC with Differential/Platelet    Standing Status:   Standing    Number of Occurrences:   22    Standing Expiration Date:   03/02/2022     HISTORY OF PRESENTING ILLNESS:   Emma Lutz 65 y.o. female is here because of thrombocytopenia.  Oncology History   No history exists.   INTERIM HISTORY  This is a very pleasant 65 year old female patient who has previously seen [place] regarding her mild thrombocytopenia here for follow-up.  Ms. Emma Lutz is here by herself.  Since her last visit, she denies any new health complaints.  She has not noticed any epistaxis, bleeding gums, petechial rash.  She denies any new medications, interim infections or hospitalizations.  She follows up with  gastroenterology closely regarding her polyposis syndrome.  No change in her bowel habits or urinary habits. Rest of the pertinent 10 point ROS reviewed and negative.  REVIEW OF SYSTEMS:   Constitutional: Denies fevers, chills or abnormal night sweats Eyes: Denies blurriness of vision, double vision or watery eyes Ears, nose, mouth, throat, and face: Denies mucositis or sore throat Respiratory: Denies cough, dyspnea or wheezes Cardiovascular: Denies palpitation, chest discomfort or lower extremity swelling Gastrointestinal:  Denies nausea, heartburn or change in bowel habits Skin: Denies abnormal skin rashes Lymphatics: Denies new lymphadenopathy or easy bruising Neurological:Denies numbness, tingling or new weaknesses Behavioral/Psych: Mood is stable, no new changes  All other systems were reviewed with the patient and are negative.   MEDICAL HISTORY:  Past Medical History:  Diagnosis Date  . Allergy   . Anemia yrs ago   no recent iron use  . Clotting disorder ([place]) 2012   post op blood clots in lungs  . 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 Familial adenomatous polyposis    subtotal colectomy, proctectomy (5/12),  duodenal and gastric adenomas, followed by [facility]  . 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 Gastric polyps 09/23/2014  . GERD (gastroesophageal reflux disease)   . History of gastrectomy 02/06/2020  . Osteoarthritis   . Pancreatitis 2012  . PONV (postoperative nausea and vomiting) 2012   nausea after last surgery  . Pre-diabetes   . Pulmonary embolism ([place]) 02/2012   multiple  . Sessile rectal polyp   . Tubular adenoma    gastric, multiple  . Wears glasses   . Wears glasses   . Wears partial dentures  SURGICAL HISTORY: Past Surgical History:  Procedure Laterality Date  . APPENDECTOMY    . BIOPSY  06/25/2018   Procedure: BIOPSY;  Surgeon: Iva Boop, MD;  Location: [facility];  Service: Endoscopy;;  . BIOPSY  11/26/2018   Procedure: BIOPSY;  Surgeon:  Lemar Lofty., MD;  Location: [facility]COPY;  Service: Gastroenterology;;  . CARPAL TUNNEL RELEASE Left   . CHOLECYSTECTOMY  08/2019  . DUODENECTOMY  08/2019  . ELBOW SURGERY Right   . ENDOSCOPIC MUCOSAL RESECTION N/A 07/17/2019   Procedure: ENDOSCOPIC MUCOSAL RESECTION;  Surgeon: Meridee Score Netty Starring., MD;  Location: [facility];  Service: Gastroenterology;  Laterality: N/A;  . ENTEROSCOPY N/A 12/16/2019   Procedure: ENTEROSCOPY;  Surgeon: Iva Boop, MD;  [facility]: [facility];  Service: Endoscopy;  Laterality: N/A;  ENTEROSCOPY WITH STENT REMOVAL   . ESOPHAGOGASTRODUODENOSCOPY    . ESOPHAGOGASTRODUODENOSCOPY N/A 10/16/2014   Procedure: ESOPHAGOGASTRODUODENOSCOPY (EGD);  Surgeon: Iva Boop, MD;  Location: [facility];  Service: Endoscopy;  Laterality: N/A;  need ercp scope  . ESOPHAGOGASTRODUODENOSCOPY N/A 12/24/2015   Procedure: ESOPHAGOGASTRODUODENOSCOPY (EGD);  Surgeon: Iva Boop, MD;  Location: [facility];  Service: Endoscopy;  Laterality: N/A;  May need ERCP scope  . ESOPHAGOGASTRODUODENOSCOPY (EGD) WITH PROPOFOL N/A 01/10/2017   Procedure: ESOPHAGOGASTRODUODENOSCOPY (EGD) WITH PROPOFOL;  Surgeon: Iva Boop, MD;  [facility];  Service: Endoscopy;  Laterality: N/A;  . ESOPHAGOGASTRODUODENOSCOPY (EGD) WITH PROPOFOL N/A 06/25/2018   Procedure: ESOPHAGOGASTRODUODENOSCOPY (EGD) WITH PROPOFOL;  Surgeon: Iva Boop, MD;  [facility];  Service: Endoscopy;  Laterality: N/A;  . ESOPHAGOGASTRODUODENOSCOPY (EGD) WITH PROPOFOL N/A 09/17/2018   Procedure: ESOPHAGOGASTRODUODENOSCOPY (EGD) WITH PROPOFOL;  Surgeon: Meridee Score Netty Starring., MD;  Location: [facility] ENDOSCOPY;  Service: Gastroenterology;  Laterality: N/A;  . ESOPHAGOGASTRODUODENOSCOPY (EGD) WITH PROPOFOL N/A 11/26/2018   Procedure: ESOPHAGOGASTRODUODENOSCOPY (EGD) WITH PROPOFOL;  Surgeon: Meridee Score Netty Starring., MD;  Location: [facility] ENDOSCOPY;  Service: Gastroenterology;  Laterality: N/A;  needs  to be a 2 hour case  . ESOPHAGOGASTRODUODENOSCOPY (EGD) WITH PROPOFOL N/A 07/17/2019   Procedure: ESOPHAGOGASTRODUODENOSCOPY (EGD) WITH PROPOFOL;  Surgeon: Meridee Score Netty Starring., MD;  [facility];  Service: Gastroenterology;  Laterality: N/A;  . FLEXIBLE SIGMOIDOSCOPY    . FLEXIBLE SIGMOIDOSCOPY N/A 10/16/2014   Procedure: FLEXIBLE SIGMOIDOSCOPY;  Surgeon: Iva Boop, MD;  [facility];  Service: Endoscopy;  Laterality: N/A;  . FLEXIBLE SIGMOIDOSCOPY N/A 12/24/2015   Procedure: FLEXIBLE SIGMOIDOSCOPY;  Surgeon: Iva Boop, MD;  [facility];  Service: Endoscopy;  Laterality: N/A;  . FLEXIBLE SIGMOIDOSCOPY N/A 01/10/2017   Procedure: FLEXIBLE SIGMOIDOSCOPY;  Surgeon: Iva Boop, MD;  [facility];  Service: Endoscopy;  Laterality: N/A;  . FLEXIBLE SIGMOIDOSCOPY N/A 06/25/2018   Procedure: FLEXIBLE SIGMOIDOSCOPY;  Surgeon: Iva Boop, MD;  [facility];  Service: Endoscopy;  Laterality: N/A;  . FLEXIBLE SIGMOIDOSCOPY N/A 07/17/2019   Procedure: FLEXIBLE SIGMOIDOSCOPY;  Surgeon: Lemar Lofty., MD;  Location: [facility];  Service: Gastroenterology;  Laterality: N/A;  . FOREIGN BODY REMOVAL  07/17/2019   Procedure: FOREIGN BODY REMOVAL;  Surgeon: Meridee Score Netty Starring., MD;  Location: [facility];  Service: Gastroenterology;;  . GASTROINTESTINAL STENT REMOVAL  12/16/2019   Procedure: GASTROINTESTINAL STENT REMOVAL;  Surgeon: Iva Boop, MD;  [facility];  Service: Endoscopy;;  . HEMOSTASIS CLIP PLACEMENT  07/17/2019   Procedure: HEMOSTASIS CLIP PLACEMENT;  Surgeon: Lemar Lofty., MD;  [facility];  Service: Gastroenterology;;  . HEMOSTASIS CONTROL  07/17/2019   Procedure:  HEMOSTASIS CONTROL;  Surgeon: Rush Landmark Telford Nab., MD;  Location: [facility];  Service: Gastroenterology;;  EPI  . HERNIA REPAIR    . ILEOSTOMY CLOSURE  08/17/11   Dr. Brooke Pace  . LUMBAR SPINE SURGERY     X 2   . MULTIPLE TOOTH EXTRACTIONS    . POLYPECTOMY  11/26/2018   Procedure: POLYPECTOMY;  Surgeon: Mansouraty, Telford Nab., MD;  Location: [place];  Service: Gastroenterology;;  . POLYPECTOMY  07/17/2019   Procedure: POLYPECTOMY;  Surgeon: Irving Copas., MD;  [facility];  Service: Gastroenterology;;  . PROCTECTOMY  02/16/11   with loop ileostomy Drt. [place]  . ROTATOR CUFF REPAIR Right 1990  . SUBMUCOSAL LIFTING INJECTION  07/17/2019   Procedure: SUBMUCOSAL LIFTING INJECTION;  Surgeon: Rush Landmark Telford Nab., MD;  Location[facility];  Service: Gastroenterology;;  . [place]   with ileostomy/ileo-rectal anastomosis  . TOTAL GASTRECTOMY  08/2019  . TUBAL LIGATION      SOCIAL HISTORY: Social History   Socioeconomic History  . Marital status: Married    Spouse name: Not on file  . Number of children: 2  . Years of education: Not on file  . Highest education level: Not on file  Occupational History  . Occupation: employed    Fish farm manager: Office manager  . Occupation: Probation officer  Tobacco Use  . Smoking status: Never Smoker  . Smokeless tobacco: Never Used  Vaping Use  . Vaping Use: Never used  Substance and Sexual Activity  . Alcohol use: No    Alcohol/week: 0.0 standard drinks  . Drug use: No  . Sexual activity: Not on file  Other Topics Concern  . Not on file  Social History Narrative   The patient is widowed she has 2 daughters and grandchildren that live in [place]   She works as a Hotel manager support person   2-3 caffeinated beverages daily   Social Determinants of Radio broadcast assistant Strain: Not on file  Food Insecurity: Not on file  Transportation Needs: Not on file  Physical Activity: Not on file  Stress: Not on file  Social Connections: Not on file  Intimate Partner Violence: Not on file    FAMILY HISTORY: Family History  Problem Relation Age of Onset  . Heart disease Mother   .  Emphysema Mother   . Familial polyposis Father   . Esophageal cancer Father   . Colon cancer Father   . Diabetes Father   . Colon polyps Father   . Kidney disease Father   . Transient ischemic attack Father   . Brain cancer Sister   . Rectal cancer Neg Hx   . Stomach cancer Neg Hx   . Inflammatory bowel disease Neg Hx   . Liver disease Neg Hx   . Pancreatic cancer Neg Hx     ALLERGIES:  is allergic to sulfonamide derivatives.  MEDICATIONS:  Current Outpatient Medications  Medication Sig Dispense Refill  . Cholecalciferol (VITAMIN D3) 50 MCG (2000 UT) TABS Take 75 mcg by mouth daily. 45 tablet 11  . Cyanocobalamin (VITAMIN B 12) 500 MCG TABS Take 1,000 capsules by mouth daily. 60 tablet 11  . Multiple Vitamin (MULTIVITAMIN) tablet Take 1 tablet by mouth daily.    Marland Kitchen nystatin-triamcinolone (MYCOLOG II) cream Apply 1 application topically daily as needed (Skin Rash).     Marland Kitchen PARoxetine (PAXIL-CR) 25 MG 24 hr tablet Take 25 mg by mouth daily.      No  current facility-administered medications for this visit.   PHYSICAL EXAMINATION: ECOG PERFORMANCE STATUS: 0 - Asymptomatic  Vitals:   03/02/21 1350  BP: 116/71  Pulse: 75  Resp: 16  Temp: 97.9 F (36.6 C)  SpO2: 100%   Filed Weights   03/02/21 1350  Weight: 161 lb 6.4 oz (73.2 kg)    GENERAL:alert, no distress and comfortable SKIN: skin color, texture, turgor are normal, no rashes or significant lesions EYES: normal, conjunctiva are pink and non-injected, sclera clear OROPHARYNX:no exudate, no erythema and lips, buccal mucosa, and tongue normal  NECK: supple, thyroid normal size, non-tender, without nodularity LYMPH:  no palpable lymphadenopathy in the cervical, axillary LUNGS: clear to auscultation and percussion with normal breathing effort HEART: regular rate & rhythm and no murmurs, chronic LE edema, no change. ABDOMEN:abdomen soft, non-tender and normal bowel sounds Musculoskeletal:no cyanosis of digits and no  clubbing PSYCH: alert & oriented x 3 with fluent speech NEURO: no focal motor/sensory deficits  LABORATORY DATA:  I have reviewed the data as listed Lab Results  Component Value Date   WBC 5.5 03/02/2021   HGB 14.9 03/02/2021   HCT 45.7 03/02/2021   MCV 90.0 03/02/2021   PLT 162 03/02/2021     Chemistry      Component Value Date/Time   NA 139 07/08/2020 1202   K 4.0 07/08/2020 1202   CL 103 07/08/2020 1202   CO2 30 07/08/2020 1202   BUN 8 07/08/2020 1202   CREATININE 0.65 07/08/2020 1202      Component Value Date/Time   CALCIUM 9.6 07/08/2020 1202   ALKPHOS 86 07/08/2020 1202   AST 20 07/08/2020 1202   ALT 13 07/08/2020 1202   BILITOT 1.1 07/08/2020 1202     RADIOGRAPHIC STUDIES: I have personally reviewed the radiological images as listed and agreed with the findings in the report. No results found.  All questions were answered. The patient knows to call the clinic with any problems, questions or concerns.    Benay Pike, MD 03/02/2021 2:36 PM

## 2021-11-01 ENCOUNTER — Other Ambulatory Visit: Payer: Self-pay | Admitting: Internal Medicine

## 2022-01-04 ENCOUNTER — Other Ambulatory Visit: Payer: Self-pay | Admitting: Internal Medicine

## 2022-01-04 DIAGNOSIS — K912 Postsurgical malabsorption, not elsewhere classified: Secondary | ICD-10-CM

## 2022-01-07 NOTE — Telephone Encounter (Signed)
We can refill but ask her to do a vit D level first ? ?Dx is postgastrectomy malabsorption ? ?

## 2022-01-07 NOTE — Telephone Encounter (Signed)
Just touching base, did you want this refilled. ?

## 2022-01-07 NOTE — Telephone Encounter (Signed)
I left Chelisa a detailed message to come get a Vitamin D level checked so we can refill her medicine. ?

## 2022-01-10 ENCOUNTER — Other Ambulatory Visit (INDEPENDENT_AMBULATORY_CARE_PROVIDER_SITE_OTHER): Payer: Medicare Other

## 2022-01-10 DIAGNOSIS — Z903 Acquired absence of stomach [part of]: Secondary | ICD-10-CM | POA: Diagnosis not present

## 2022-01-10 DIAGNOSIS — K912 Postsurgical malabsorption, not elsewhere classified: Secondary | ICD-10-CM

## 2022-01-11 LAB — VITAMIN D 25 HYDROXY (VIT D DEFICIENCY, FRACTURES): VITD: 35.75 ng/mL (ref 30.00–100.00)

## 2022-01-20 ENCOUNTER — Telehealth: Payer: Self-pay | Admitting: Hematology and Oncology

## 2022-01-20 NOTE — Telephone Encounter (Signed)
Rescheduled appointment per providers. Patient aware.  ? ?

## 2022-02-16 ENCOUNTER — Other Ambulatory Visit (INDEPENDENT_AMBULATORY_CARE_PROVIDER_SITE_OTHER): Payer: Medicare Other

## 2022-02-16 ENCOUNTER — Encounter: Payer: Self-pay | Admitting: Internal Medicine

## 2022-02-16 ENCOUNTER — Ambulatory Visit (INDEPENDENT_AMBULATORY_CARE_PROVIDER_SITE_OTHER): Payer: Medicare Other | Admitting: Internal Medicine

## 2022-02-16 VITALS — BP 100/60 | HR 78 | Ht 62.0 in | Wt 164.5 lb

## 2022-02-16 DIAGNOSIS — K912 Postsurgical malabsorption, not elsewhere classified: Secondary | ICD-10-CM | POA: Diagnosis not present

## 2022-02-16 DIAGNOSIS — D126 Benign neoplasm of colon, unspecified: Secondary | ICD-10-CM

## 2022-02-16 DIAGNOSIS — Z903 Acquired absence of stomach [part of]: Secondary | ICD-10-CM | POA: Diagnosis not present

## 2022-02-16 LAB — B12 AND FOLATE PANEL
Folate: >23.5 ng/mL (ref >5.9)
Vitamin B-12: 464 pg/mL (ref 211–911)

## 2022-02-16 LAB — FERRITIN: Ferritin: 16.1 ng/mL (ref 10.0–291.0)

## 2022-02-16 NOTE — Patient Instructions (Signed)
You have been scheduled for a flexible sigmoidoscopy. Please follow the written instructions given to you at your visit today. ?If you use inhalers (even only as needed), please bring them with you on the day of your procedure. ? ?Your provider has requested that you go to the basement level for lab work before leaving today. Press "B" on the elevator. The lab is located at the first door on the left as you exit the elevator. ? ?Due to recent changes in healthcare laws, you may see the results of your imaging and laboratory studies on MyChart before your provider has had a chance to review them.  We understand that in some cases there may be results that are confusing or concerning to you. Not all laboratory results come back in the same time frame and the provider may be waiting for multiple results in order to interpret others.  Please give [place] 48 hours in order for your provider to thoroughly review all the results before contacting the office for clarification of your results.  ? ?We are referring you to [facility] within [facility] for an evaluation for your post op malabsorption. ? ? ?I appreciate the opportunity to care for you. ?Silvano Rusk, MD, [facility]  ?

## 2022-02-16 NOTE — Progress Notes (Signed)
? ?SUZI HERNAN y.o. 1956/02/13 [id number] ? ?Assessment & Plan:  ? ?Encounter Diagnoses  ?Name Primary?  ? FAP (familial adenomatous polyposis) Yes  ? Postgastrectomy malabsorption   ? ?Schedule surveillance pouchoscopy. ? ?Malabsorption nutrition lab follow-up as below with testing and evaluation with nutrition and diabetes services. ? ?Orders Placed This Encounter  ?Procedures  ? B12 and Folate Panel  ? Vitamin B1  ? Zinc  ? Ferritin  ? Referral to Nutrition and Diabetes Services  ? Ambulatory referral to Gastroenterology  ? ? ? ? ? ? ?Subjective:  ? ?Chief Complaint: Follow-up FAP status post gastrectomy and colectomy ? ?HPI ?Jennavie is a 66 year old woman with FAP status post subtotal colectomy and end proctectomy, and total gastrectomy and duodenectomy because of adenomatous duodenal polyps (Dr. Donnal Moat, [facility]-08/30/2019).  She reports she is doing well.  Not having diarrhea bleeding significant abdominal pain.  She recently requested a refill on vitamin D supplementation and I had her do a vitamin D level. ? ?Last vitamin D ?Lab Results  ?Component Value Date  ? VD25OH 35.75 01/10/2022  ? ?She takes a multivitamin as well and has been on B12 supplementation.  There has been some iron deficiency in the past also.  She is back on Reclast for osteoporosis. ? ?She enjoys spending time with her family which includes her children and grandchildren. ?Wt Readings from Last 3 Encounters:  ?02/16/22 164 lb 8 oz (74.6 kg)  ?03/02/21 161 lb 6.4 oz (73.2 kg)  ?10/30/20 160 lb (72.6 kg)  ? ? ? ? ?Allergies  ?Allergen Reactions  ? Sulfonamide Derivatives Rash  ? ?Current Meds  ?Medication Sig  ? Cholecalciferol (VITAMIN D3) 50 MCG (2000 UT) TABS TAKE 1 & 1/2 (ONE & ONE-HALF) TABLETS BY MOUTH ONCE DAILY  ? Cyanocobalamin (VITAMIN B 12) 500 MCG TABS Take 1,000 capsules by mouth daily.  ? Multiple Vitamin (MULTIVITAMIN) tablet Take 1 tablet by mouth daily.  ? nystatin-triamcinolone (MYCOLOG II) cream Apply 1  application topically daily as needed (Skin Rash).   ? PARoxetine (PAXIL-CR) 25 MG 24 hr tablet Take 25 mg by mouth daily.   ? zoledronic acid (RECLAST) 5 MG/100ML SOLN injection Inject 5 mg into the vein as directed. Once a year  ? ?Past Medical History:  ?Diagnosis Date  ? Allergy   ? Anemia yrs ago  ? no recent iron use  ? Clotting disorder ([place]) 2012  ? post op blood clots in lungs  ? 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 Familial adenomatous polyposis   ? subtotal colectomy, proctectomy (5/12),  duodenal and gastric adenomas, followed by [facility]  ? 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 Gastric polyps 09/23/2014  ? GERD (gastroesophageal reflux disease)   ? History of gastrectomy 02/06/2020  ? Osteoarthritis   ? Pancreatitis 2012  ? PONV (postoperative nausea and vomiting) 2012  ? nausea after last surgery  ? Pre-diabetes   ? Pulmonary embolism ([place]) 02/2012  ? multiple  ? Sessile rectal polyp   ? Tubular adenoma   ? gastric, multiple  ? Wears glasses   ? Wears glasses   ? Wears partial dentures   ? ?Past Surgical History:  ?Procedure Laterality Date  ? APPENDECTOMY    ? BIOPSY  06/25/2018  ? Procedure: BIOPSY;  Surgeon: Gatha Mayer, MD;  [facility];  Service: Endoscopy;;  ? BIOPSY  11/26/2018  ? Procedure: BIOPSY;  Surgeon: Irving Copas., MD;  Location: [place];  Service: Gastroenterology;;  ? CARPAL TUNNEL RELEASE  Left   ? CHOLECYSTECTOMY  08/2019  ? DUODENECTOMY  08/2019  ? ELBOW SURGERY Right   ? ENDOSCOPIC MUCOSAL RESECTION N/A 07/17/2019  ? Procedure: ENDOSCOPIC MUCOSAL RESECTION;  Surgeon: Rush Landmark Telford Nab., MD;  Location: [facility];  Service: Gastroenterology;  Laterality: N/A;  ? ENTEROSCOPY N/A 12/16/2019  ? Procedure: ENTEROSCOPY;  Surgeon: Gatha Mayer, MD;  Location: [facility];  Service: Endoscopy;  Laterality: N/A;  ENTEROSCOPY WITH STENT REMOVAL   ? ESOPHAGOGASTRODUODENOSCOPY    ? ESOPHAGOGASTRODUODENOSCOPY N/A 10/16/2014  ? Procedure: ESOPHAGOGASTRODUODENOSCOPY  (EGD);  Surgeon: Gatha Mayer, MD;  Location: [facility];  Service: Endoscopy;  Laterality: N/A;  need ercp scope  ? ESOPHAGOGASTRODUODENOSCOPY N/A 12/24/2015  ? Procedure: ESOPHAGOGASTRODUODENOSCOPY (EGD);  Surgeon: Gatha Mayer, MD;  Location: [facility];  Service: Endoscopy;  Laterality: N/A;  May need ERCP scope  ? ESOPHAGOGASTRODUODENOSCOPY (EGD) WITH PROPOFOL N/A 01/10/2017  ? Procedure: ESOPHAGOGASTRODUODENOSCOPY (EGD) WITH PROPOFOL;  Surgeon: Gatha Mayer, MD;  [facility];  Service: Endoscopy;  Laterality: N/A;  ? ESOPHAGOGASTRODUODENOSCOPY (EGD) WITH PROPOFOL N/A 06/25/2018  ? Procedure: ESOPHAGOGASTRODUODENOSCOPY (EGD) WITH PROPOFOL;  Surgeon: Gatha Mayer, MD;  [facility];  Service: Endoscopy;  Laterality: N/A;  ? ESOPHAGOGASTRODUODENOSCOPY (EGD) WITH PROPOFOL N/A 09/17/2018  ? Procedure: ESOPHAGOGASTRODUODENOSCOPY (EGD) WITH PROPOFOL;  Surgeon: Rush Landmark Telford Nab., MD;  Location: [place];  Service: Gastroenterology;  Laterality: N/A;  ? ESOPHAGOGASTRODUODENOSCOPY (EGD) WITH PROPOFOL N/A 11/26/2018  ? Procedure: ESOPHAGOGASTRODUODENOSCOPY (EGD) WITH PROPOFOL;  Surgeon: Rush Landmark Telford Nab., MD;  Location: [place];  Service: Gastroenterology;  Laterality: N/A;  needs to be a 2 hour case  ? ESOPHAGOGASTRODUODENOSCOPY (EGD) WITH PROPOFOL N/A 07/17/2019  ? Procedure: ESOPHAGOGASTRODUODENOSCOPY (EGD) WITH PROPOFOL;  Surgeon: Rush Landmark Telford Nab., MD;  Location: [facility];  Service: Gastroenterology;  Laterality: N/A;  ? FLEXIBLE SIGMOIDOSCOPY    ? FLEXIBLE SIGMOIDOSCOPY N/A 10/16/2014  ? Procedure: FLEXIBLE SIGMOIDOSCOPY;  Surgeon: Gatha Mayer, MD;  [facility];  Service: Endoscopy;  Laterality: N/A;  ? FLEXIBLE SIGMOIDOSCOPY N/A 12/24/2015  ? Procedure: FLEXIBLE SIGMOIDOSCOPY;  Surgeon: Gatha Mayer, MD;  [facility];  Service: Endoscopy;  Laterality: N/A;  ? FLEXIBLE SIGMOIDOSCOPY N/A 01/10/2017  ? Procedure: FLEXIBLE SIGMOIDOSCOPY;   Surgeon: Gatha Mayer, MD;  [facility];  Service: Endoscopy;  Laterality: N/A;  ? FLEXIBLE SIGMOIDOSCOPY N/A 06/25/2018  ? Procedure: FLEXIBLE SIGMOIDOSCOPY;  Surgeon: Gatha Mayer, MD;  Location: [facility];  Service: Endoscopy;  Laterality: N/A;  ? FLEXIBLE SIGMOIDOSCOPY N/A 07/17/2019  ? Procedure: FLEXIBLE SIGMOIDOSCOPY;  Surgeon: Irving Copas., MD;  Location: [facility];  Service: Gastroenterology;  Laterality: N/A;  ? FOREIGN BODY REMOVAL  07/17/2019  ? Procedure: FOREIGN BODY REMOVAL;  Surgeon: Rush Landmark Telford Nab., MD;  Location: [facility];  Service: Gastroenterology;;  ? GASTROINTESTINAL STENT REMOVAL  12/16/2019  ? Procedure: GASTROINTESTINAL STENT REMOVAL;  Surgeon: Gatha Mayer, MD;  [facility];  Service: Endoscopy;;  ? HEMOSTASIS CLIP PLACEMENT  07/17/2019  ? Procedure: HEMOSTASIS CLIP PLACEMENT;  Surgeon: Irving Copas., MD;  Location: [facility];  Service: Gastroenterology;;  ? HEMOSTASIS CONTROL  07/17/2019  ? Procedure: HEMOSTASIS CONTROL;  Surgeon: Rush Landmark Telford Nab., MD;  Location: [facility];  Service: Gastroenterology;;  EPI  ? HERNIA REPAIR    ? ILEOSTOMY CLOSURE  08/17/11  ? Dr. Sheryn Bison [facility]  ? LUMBAR SPINE SURGERY    ? X 2  ? MULTIPLE TOOTH EXTRACTIONS    ? POLYPECTOMY  11/26/2018  ? Procedure: POLYPECTOMY;  Surgeon:  Mansouraty, Telford Nab., MD;  Location: [place];  Service: Gastroenterology;;  ? POLYPECTOMY  07/17/2019  ? Procedure: POLYPECTOMY;  Surgeon: Irving Copas., MD;  Location: [facility];  Service: Gastroenterology;;  ? PROCTECTOMY  02/16/11  ? with loop ileostomy Drt. [place] [facility]  ? ROTATOR CUFF REPAIR Right 1990  ? SUBMUCOSAL LIFTING INJECTION  07/17/2019  ? Procedure: SUBMUCOSAL LIFTING INJECTION;  Surgeon: Irving Copas., MD;  Location: [facility];  Service: Gastroenterology;;  ? [place]  ? with ileostomy/ileo-rectal anastomosis  ? TOTAL GASTRECTOMY  08/2019  ? TUBAL LIGATION     ? ?Social History  ? ?Social History Narrative  ? The patient is widowed she has 2 daughters and grandchildren that live in [place]  ? She works as a Market researcher person-retired  ? 2-3 caffeinated beve

## 2022-02-22 LAB — ZINC

## 2022-02-22 LAB — EXTRA SPECIMEN

## 2022-02-22 LAB — VITAMIN B1: Vitamin B1 (Thiamine): 14 nmol/L (ref 8–30)

## 2022-02-23 ENCOUNTER — Other Ambulatory Visit: Payer: Medicare Other

## 2022-02-23 ENCOUNTER — Telehealth: Payer: Self-pay

## 2022-02-23 DIAGNOSIS — K912 Postsurgical malabsorption, not elsewhere classified: Secondary | ICD-10-CM

## 2022-02-23 NOTE — Telephone Encounter (Signed)
Zinc test was cancelled by [facility] due to equipment failure. They say it is fixed so I called and left Catlynn a message to come back and have this redrawn. Order re-entered. ?

## 2022-02-25 NOTE — Telephone Encounter (Signed)
Emma Lutz came 02/23/2022 and had zinc re-drawn. ?

## 2022-02-27 LAB — ZINC: Zinc: 69 ug/dL (ref 60–130)

## 2022-02-28 ENCOUNTER — Other Ambulatory Visit: Payer: Self-pay | Admitting: Internal Medicine

## 2022-02-28 ENCOUNTER — Encounter: Payer: Self-pay | Admitting: Internal Medicine

## 2022-03-02 ENCOUNTER — Ambulatory Visit: Payer: Medicare Other | Admitting: Hematology and Oncology

## 2022-03-02 ENCOUNTER — Other Ambulatory Visit: Payer: Self-pay | Admitting: Internal Medicine

## 2022-03-02 DIAGNOSIS — E559 Vitamin D deficiency, unspecified: Secondary | ICD-10-CM

## 2022-03-09 ENCOUNTER — Encounter: Payer: Self-pay | Admitting: Hematology and Oncology

## 2022-03-09 ENCOUNTER — Inpatient Hospital Stay: Payer: Medicare Other | Attending: Hematology and Oncology | Admitting: Hematology and Oncology

## 2022-03-09 ENCOUNTER — Other Ambulatory Visit: Payer: Self-pay

## 2022-03-09 ENCOUNTER — Inpatient Hospital Stay: Payer: Medicare Other

## 2022-03-09 VITALS — BP 125/86 | HR 77 | Temp 97.8°F | Resp 16 | Ht 62.0 in | Wt 163.7 lb

## 2022-03-09 DIAGNOSIS — D696 Thrombocytopenia, unspecified: Secondary | ICD-10-CM | POA: Insufficient documentation

## 2022-03-09 LAB — CBC WITH DIFFERENTIAL/PLATELET
Abs Immature Granulocytes: 0.01 10*3/uL (ref 0.00–0.07)
Basophils Absolute: 0 10*3/uL (ref 0.0–0.1)
Basophils Relative: 1 %
Eosinophils Absolute: 0.1 10*3/uL (ref 0.0–0.5)
Eosinophils Relative: 2 %
HCT: 45.7 % (ref 36.0–46.0)
Hemoglobin: 15 g/dL (ref 12.0–15.0)
Immature Granulocytes: 0 %
Lymphocytes Relative: 33 %
Lymphs Abs: 1.3 10*3/uL (ref 0.7–4.0)
MCH: 29.3 pg (ref 26.0–34.0)
MCHC: 32.8 g/dL (ref 30.0–36.0)
MCV: 89.3 fL (ref 80.0–100.0)
Monocytes Absolute: 0.5 10*3/uL (ref 0.1–1.0)
Monocytes Relative: 12 %
Neutro Abs: 2.1 10*3/uL (ref 1.7–7.7)
Neutrophils Relative %: 52 %
Platelets: 146 10*3/uL — ABNORMAL LOW (ref 150–400)
RBC: 5.12 MIL/uL — ABNORMAL HIGH (ref 3.87–5.11)
RDW: 13.2 % (ref 11.5–15.5)
WBC: 4.1 10*3/uL (ref 4.0–10.5)
nRBC: 0 % (ref 0.0–0.2)

## 2022-03-09 NOTE — Assessment & Plan Note (Signed)
This is a very pleasant 66 year old female patient who was referred to hematology for evaluation of thrombocytopenia.   She has past medical history significant for FAP status post total gastrectomy, proctocolectomy and follows up with gastroenterology regularly. Since her last visit, she was started on oral iron supplementation since her ferritin was 16.  She has been tolerating this well.  No adverse effects.  She denies any bleeding complaints today.  Physical examination unremarkable today.  We will repeat CBC today.  If CBC continues to show mild thrombocytopenia, we can follow-up with her in about 6 months to repeat iron profile and ferritin.  She is agreeable to these recommendations.  Age-appropriate cancer screening recommended.

## 2022-03-09 NOTE — Progress Notes (Signed)
Emma Lutz FOLLOW UP NOTE  Patient Care Team: Pcp, No as PCP - General  CHIEF COMPLAINTS/PURPOSE OF CONSULTATION:  Thrombocytopenia, follow-up  ASSESSMENT & PLAN:  Thrombocytopenia ([place]) This is a very pleasant 66 year old female patient who was referred to hematology for evaluation of thrombocytopenia.   She has past medical history significant for FAP status post total gastrectomy, proctocolectomy and follows up with gastroenterology regularly. Since her last visit, she was started on oral iron supplementation since her ferritin was 16.  She has been tolerating this well.  No adverse effects.  She denies any bleeding complaints today.  Physical examination unremarkable today.  We will repeat CBC today.  If CBC continues to show mild thrombocytopenia, we can follow-up with her in about 6 months to repeat iron profile and ferritin.  She is agreeable to these recommendations.  Age-appropriate cancer screening recommended.   Orders Placed This Encounter  Procedures  . CBC with Differential/Platelet    Standing Status:   Standing    Number of Occurrences:   22    Standing Expiration Date:   03/10/2023     HISTORY OF PRESENTING ILLNESS:   Emma Lutz 66 y.o. female is here because of thrombocytopenia.  Oncology History   No history exists.   INTERIM HISTORY  This is a very pleasant 66 year old female patient who has previously seen [place] regarding her mild thrombocytopenia here for follow-up.  Since last visit, She started taking oral iron supplementation recently she said, tolerating it well. No bleeding issues. Rest of the pertinent 10 point ROS reviewed and negative.  REVIEW OF SYSTEMS:   Constitutional: Denies fevers, chills or abnormal night sweats Eyes: Denies blurriness of vision, double vision or watery eyes Ears, nose, mouth, throat, and face: Denies mucositis or sore throat Respiratory: Denies cough, dyspnea or wheezes Cardiovascular: Denies  palpitation, chest discomfort or lower extremity swelling Gastrointestinal:  Denies nausea, heartburn or change in bowel habits Skin: Denies abnormal skin rashes Lymphatics: Denies new lymphadenopathy or easy bruising Neurological:Denies numbness, tingling or new weaknesses Behavioral/Psych: Mood is stable, no new changes  All other systems were reviewed with the patient and are negative.   MEDICAL HISTORY:  Past Medical History:  Diagnosis Date  . Allergy   . Anemia yrs ago   no recent iron use  . Clotting disorder ([place]) 2012   post op blood clots in lungs  . 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 Familial adenomatous polyposis    subtotal colectomy, proctectomy (5/12),  duodenal and gastric adenomas, followed by [facility] ([place])  . 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 Gastric polyps 09/23/2014  . GERD (gastroesophageal reflux disease)   . History of gastrectomy 02/06/2020  . Osteoarthritis   . Pancreatitis 2012  . PONV (postoperative nausea and vomiting) 2012   nausea after last surgery  . Pre-diabetes   . Pulmonary embolism ([place]) 02/2012   multiple  . Sessile rectal polyp   . Tubular adenoma    gastric, multiple  . Wears glasses   . Wears glasses   . Wears partial dentures     SURGICAL HISTORY: Past Surgical History:  Procedure Laterality Date  . APPENDECTOMY    . BIOPSY  06/25/2018   Procedure: BIOPSY;  Surgeon: Gatha Mayer, MD;  Location: [facility];  Service: Endoscopy;;  . BIOPSY  11/26/2018   Procedure: BIOPSY;  Surgeon: Irving Copas., MD;  Location: [facility];  Service: Gastroenterology;;  . CARPAL TUNNEL RELEASE Left   . CHOLECYSTECTOMY  08/2019  .  DUODENECTOMY  08/2019  . ELBOW SURGERY Right   . ENDOSCOPIC MUCOSAL RESECTION N/A 07/17/2019   Procedure: ENDOSCOPIC MUCOSAL RESECTION;  Surgeon: Rush Landmark Telford Nab., MD;  Location: [facility];  Service: Gastroenterology;  Laterality: N/A;  . ENTEROSCOPY N/A 12/16/2019   Procedure: ENTEROSCOPY;  Surgeon:  Gatha Mayer, MD;  Location: [facility];  Service: Endoscopy;  Laterality: N/A;  ENTEROSCOPY WITH STENT REMOVAL   . ESOPHAGOGASTRODUODENOSCOPY    . ESOPHAGOGASTRODUODENOSCOPY N/A 10/16/2014   Procedure: ESOPHAGOGASTRODUODENOSCOPY (EGD);  Surgeon: Gatha Mayer, MD;  Location: [facility];  Service: Endoscopy;  Laterality: N/A;  need ercp scope  . ESOPHAGOGASTRODUODENOSCOPY N/A 12/24/2015   Procedure: ESOPHAGOGASTRODUODENOSCOPY (EGD);  Surgeon: Gatha Mayer, MD;  Location: [facility];  Service: Endoscopy;  Laterality: N/A;  May need ERCP scope  . ESOPHAGOGASTRODUODENOSCOPY (EGD) WITH PROPOFOL N/A 01/10/2017   Procedure: ESOPHAGOGASTRODUODENOSCOPY (EGD) WITH PROPOFOL;  Surgeon: Gatha Mayer, MD;  Location: [facility];  Service: Endoscopy;  Laterality: N/A;  . ESOPHAGOGASTRODUODENOSCOPY (EGD) WITH PROPOFOL N/A 06/25/2018   Procedure: ESOPHAGOGASTRODUODENOSCOPY (EGD) WITH PROPOFOL;  Surgeon: Gatha Mayer, MD;  Location: [facility];  Service: Endoscopy;  Laterality: N/A;  . ESOPHAGOGASTRODUODENOSCOPY (EGD) WITH PROPOFOL N/A 09/17/2018   Procedure: ESOPHAGOGASTRODUODENOSCOPY (EGD) WITH PROPOFOL;  Surgeon: Rush Landmark Telford Nab., MD;  Location: [place];  Service: Gastroenterology;  Laterality: N/A;  . ESOPHAGOGASTRODUODENOSCOPY (EGD) WITH PROPOFOL N/A 11/26/2018   Procedure: ESOPHAGOGASTRODUODENOSCOPY (EGD) WITH PROPOFOL;  Surgeon: Rush Landmark Telford Nab., MD;  Location: [place];  Service: Gastroenterology;  Laterality: N/A;  needs to be a 2 hour case  . ESOPHAGOGASTRODUODENOSCOPY (EGD) WITH PROPOFOL N/A 07/17/2019   Procedure: ESOPHAGOGASTRODUODENOSCOPY (EGD) WITH PROPOFOL;  Surgeon: Rush Landmark Telford Nab., MD;  [facility];  Service: Gastroenterology;  Laterality: N/A;  . FLEXIBLE SIGMOIDOSCOPY    . FLEXIBLE SIGMOIDOSCOPY N/A 10/16/2014   Procedure: FLEXIBLE SIGMOIDOSCOPY;  Surgeon: Gatha Mayer, MD;  Location: [facility];  Service: Endoscopy;  Laterality: N/A;  .  FLEXIBLE SIGMOIDOSCOPY N/A 12/24/2015   Procedure: FLEXIBLE SIGMOIDOSCOPY;  Surgeon: Gatha Mayer, MD;  Location: [facility];  Service: Endoscopy;  Laterality: N/A;  . FLEXIBLE SIGMOIDOSCOPY N/A 01/10/2017   Procedure: FLEXIBLE SIGMOIDOSCOPY;  Surgeon: Gatha Mayer, MD;  [facility]: [facility];  Service: Endoscopy;  Laterality: N/A;  . FLEXIBLE SIGMOIDOSCOPY N/A 06/25/2018   Procedure: FLEXIBLE SIGMOIDOSCOPY;  Surgeon: Gatha Mayer, MD;  [facility];  Service: Endoscopy;  Laterality: N/A;  . FLEXIBLE SIGMOIDOSCOPY N/A 07/17/2019   Procedure: FLEXIBLE SIGMOIDOSCOPY;  Surgeon: Irving Copas., MD;  Location: [facility];  Service: Gastroenterology;  Laterality: N/A;  . FOREIGN BODY REMOVAL  07/17/2019   Procedure: FOREIGN BODY REMOVAL;  Surgeon: Rush Landmark Telford Nab., MD;  Location: [facility];  Service: Gastroenterology;;  . GASTROINTESTINAL STENT REMOVAL  12/16/2019   Procedure: GASTROINTESTINAL STENT REMOVAL;  Surgeon: Gatha Mayer, MD;  Location: [facility];  Service: Endoscopy;;  . HEMOSTASIS CLIP PLACEMENT  07/17/2019   Procedure: HEMOSTASIS CLIP PLACEMENT;  Surgeon: Irving Copas., MD;  Location: [facility];  Service: Gastroenterology;;  . HEMOSTASIS CONTROL  07/17/2019   Procedure: HEMOSTASIS CONTROL;  Surgeon: Irving Copas., MD;  Location: [facility];  Service: Gastroenterology;;  EPI  . HERNIA REPAIR    . ILEOSTOMY CLOSURE  08/17/11   Dr. Brooke Pace  . LUMBAR SPINE SURGERY     X 2  . MULTIPLE TOOTH EXTRACTIONS    . POLYPECTOMY  11/26/2018   Procedure: POLYPECTOMY;  Surgeon: Mansouraty, Telford Nab., MD;  Location: [place];  Service: Gastroenterology;;  . POLYPECTOMY  07/17/2019   Procedure: POLYPECTOMY;  Surgeon: Irving Copas., MD;  Location: [facility];  Service: Gastroenterology;;  . PROCTECTOMY  02/16/11   with loop ileostomy [place]  . ROTATOR CUFF REPAIR Right 1990  . SUBMUCOSAL LIFTING INJECTION   07/17/2019   Procedure: SUBMUCOSAL LIFTING INJECTION;  Surgeon: Rush Landmark Telford Nab., MD;  Location: [facility];  Service: Gastroenterology;;  . [place]   with ileostomy/ileo-rectal anastomosis  . TOTAL GASTRECTOMY  08/2019  . TUBAL LIGATION      SOCIAL HISTORY: Social History   Socioeconomic History  . Marital status: Widowed    Spouse name: Not on file  . Number of children: 2  . Years of education: Not on file  . Highest education level: Not on file  Occupational History  . Occupation: employed    Fish farm manager: Office manager  . Occupation: Probation officer  Tobacco Use  . Smoking status: Never  . Smokeless tobacco: Never  Vaping Use  . Vaping Use: Never used  Substance and Sexual Activity  . Alcohol use: No    Alcohol/week: 0.0 standard drinks  . Drug use: No  . Sexual activity: Not on file  Other Topics Concern  . Not on file  Social History Narrative   The patient is widowed she has 2 daughters and grandchildren that live in [place]   She works as a Market researcher person-retired   2-3 caffeinated beverages daily   Social Determinants of Radio broadcast assistant Strain: Not on file  Food Insecurity: Not on file  Transportation Needs: Not on file  Physical Activity: Not on file  Stress: Not on file  Social Connections: Not on file  Intimate Partner Violence: Not on file    FAMILY HISTORY: Family History  Problem Relation Age of Onset  . Heart disease Mother   . Emphysema Mother   . Familial polyposis Father   . Esophageal cancer Father   . Colon cancer Father   . Diabetes Father   . Colon polyps Father   . Kidney disease Father   . Transient ischemic attack Father   . Brain cancer Sister   . Rectal cancer Neg Hx   . Stomach cancer Neg Hx   . Inflammatory bowel disease Neg Hx   . Liver disease Neg Hx   . Pancreatic cancer Neg Hx     ALLERGIES:  is allergic to sulfonamide derivatives.  MEDICATIONS:   Current Outpatient Medications  Medication Sig Dispense Refill  . Cholecalciferol (VITAMIN D3) 50 MCG (2000 UT) TABS TAKE 1 & 1/2 (ONE & ONE-HALF) TABLETS BY MOUTH ONCE DAILY 45 tablet 0  . Cyanocobalamin (VITAMIN B 12) 500 MCG TABS Take 1,000 capsules by mouth daily. 60 tablet 11  . Multiple Vitamin (MULTIVITAMIN) tablet Take 1 tablet by mouth daily.    Marland Kitchen nystatin-triamcinolone (MYCOLOG II) cream Apply 1 application topically daily as needed (Skin Rash).     Marland Kitchen PARoxetine (PAXIL-CR) 25 MG 24 hr tablet Take 25 mg by mouth daily.     . zoledronic acid (RECLAST) 5 MG/100ML SOLN injection Inject 5 mg into the vein as directed. Once a year     No current facility-administered medications for this visit.   PHYSICAL EXAMINATION: ECOG PERFORMANCE STATUS: 0 - Asymptomatic  Vitals:   03/09/22 1445  BP: 125/86  Pulse: 77  Resp: 16  Temp: 97.8 F (36.6 C)  SpO2: 97%   Filed Weights  03/09/22 1445  Weight: 163 lb 11.2 oz (74.3 kg)    Physical Exam Constitutional:      Appearance: Normal appearance.  HENT:     Head: Normocephalic and atraumatic.  Cardiovascular:     Rate and Rhythm: Normal rate and regular rhythm.  Pulmonary:     Effort: Pulmonary effort is normal.     Breath sounds: Normal breath sounds.  Musculoskeletal:        General: No swelling or tenderness.  Neurological:     General: No focal deficit present.     Mental Status: She is alert.  Psychiatric:        Mood and Affect: Mood normal.     LABORATORY DATA:  I have reviewed the data as listed Lab Results  Component Value Date   WBC 5.5 03/02/2021   HGB 14.9 03/02/2021   HCT 45.7 03/02/2021   MCV 90.0 03/02/2021   PLT 162 03/02/2021     Chemistry      Component Value Date/Time   NA 139 07/08/2020 1202   K 4.0 07/08/2020 1202   CL 103 07/08/2020 1202   CO2 30 07/08/2020 1202   BUN 8 07/08/2020 1202   CREATININE 0.65 07/08/2020 1202      Component Value Date/Time   CALCIUM 9.6 07/08/2020 1202    ALKPHOS 86 07/08/2020 1202   AST 20 07/08/2020 1202   ALT 13 07/08/2020 1202   BILITOT 1.1 07/08/2020 1202     RADIOGRAPHIC STUDIES: I have personally reviewed the radiological images as listed and agreed with the findings in the report. No results found.  All questions were answered. The patient knows to call the clinic with any problems, questions or concerns.    Benay Pike, MD 03/09/2022 3:22 PM

## 2022-04-03 ENCOUNTER — Encounter: Payer: Self-pay | Admitting: Certified Registered Nurse Anesthetist

## 2022-04-08 ENCOUNTER — Encounter: Payer: Self-pay | Admitting: Internal Medicine

## 2022-04-08 ENCOUNTER — Ambulatory Visit (AMBULATORY_SURGERY_CENTER): Payer: Medicare Other | Admitting: Internal Medicine

## 2022-04-08 VITALS — BP 106/70 | HR 60 | Temp 98.4°F | Resp 14 | Ht 62.0 in | Wt 164.0 lb

## 2022-04-08 DIAGNOSIS — Z8371 Family history of colonic polyps: Secondary | ICD-10-CM

## 2022-04-08 DIAGNOSIS — D128 Benign neoplasm of rectum: Secondary | ICD-10-CM

## 2022-04-08 DIAGNOSIS — D126 Benign neoplasm of colon, unspecified: Secondary | ICD-10-CM

## 2022-04-08 DIAGNOSIS — Z09 Encounter for follow-up examination after completed treatment for conditions other than malignant neoplasm: Secondary | ICD-10-CM | POA: Diagnosis not present

## 2022-04-08 DIAGNOSIS — Z86010 Personal history of colonic polyps: Secondary | ICD-10-CM

## 2022-04-08 MED ORDER — SODIUM CHLORIDE 0.9 % IV SOLN: 500.0000 mL | Freq: Once | INTRAVENOUS | Status: DC

## 2022-04-11 ENCOUNTER — Telehealth: Payer: Self-pay | Admitting: *Deleted

## 2022-04-14 ENCOUNTER — Encounter: Payer: Self-pay | Admitting: Internal Medicine

## 2022-07-25 ENCOUNTER — Telehealth: Payer: Self-pay | Admitting: Hematology and Oncology

## 2022-07-25 NOTE — Telephone Encounter (Signed)
Contacted patient to scheduled appointments. Left message with appointment details and a call back number if patient had any questions or could not accommodate the time we provided.   

## 2022-09-09 ENCOUNTER — Ambulatory Visit: Payer: Medicare Other | Admitting: Hematology and Oncology

## 2022-09-12 ENCOUNTER — Inpatient Hospital Stay: Payer: Medicare Other

## 2022-09-12 ENCOUNTER — Encounter: Payer: Self-pay | Admitting: Hematology and Oncology

## 2022-09-12 ENCOUNTER — Inpatient Hospital Stay: Payer: Medicare Other | Attending: Hematology and Oncology | Admitting: Hematology and Oncology

## 2022-09-12 VITALS — BP 142/90 | HR 78 | Temp 97.9°F | Resp 16 | Ht 62.0 in | Wt 164.3 lb

## 2022-09-12 DIAGNOSIS — D696 Thrombocytopenia, unspecified: Secondary | ICD-10-CM | POA: Insufficient documentation

## 2022-09-12 DIAGNOSIS — Z9049 Acquired absence of other specified parts of digestive tract: Secondary | ICD-10-CM | POA: Diagnosis not present

## 2022-09-12 DIAGNOSIS — Z800 Family history of malignant neoplasm of digestive organs: Secondary | ICD-10-CM | POA: Diagnosis not present

## 2022-09-12 DIAGNOSIS — Z79899 Other long term (current) drug therapy: Secondary | ICD-10-CM | POA: Diagnosis not present

## 2022-09-12 LAB — CBC WITH DIFFERENTIAL/PLATELET
Abs Immature Granulocytes: 0.01 10*3/uL (ref 0.00–0.07)
Basophils Absolute: 0 10*3/uL (ref 0.0–0.1)
Basophils Relative: 1 %
Eosinophils Absolute: 0.1 10*3/uL (ref 0.0–0.5)
Eosinophils Relative: 2 %
HCT: 46.8 % — ABNORMAL HIGH (ref 36.0–46.0)
Hemoglobin: 15.5 g/dL — ABNORMAL HIGH (ref 12.0–15.0)
Immature Granulocytes: 0 %
Lymphocytes Relative: 36 %
Lymphs Abs: 1.5 10*3/uL (ref 0.7–4.0)
MCH: 30.1 pg (ref 26.0–34.0)
MCHC: 33.1 g/dL (ref 30.0–36.0)
MCV: 90.9 fL (ref 80.0–100.0)
Monocytes Absolute: 0.5 10*3/uL (ref 0.1–1.0)
Monocytes Relative: 11 %
Neutro Abs: 2 10*3/uL (ref 1.7–7.7)
Neutrophils Relative %: 50 %
Platelets: 144 10*3/uL — ABNORMAL LOW (ref 150–400)
RBC: 5.15 MIL/uL — ABNORMAL HIGH (ref 3.87–5.11)
RDW: 12.6 % (ref 11.5–15.5)
WBC: 4.1 10*3/uL (ref 4.0–10.5)
nRBC: 0 % (ref 0.0–0.2)

## 2022-09-12 LAB — IRON AND IRON BINDING CAPACITY (CC-WL,HP ONLY)
Iron: 125 ug/dL (ref 28–170)
Saturation Ratios: 33 % — ABNORMAL HIGH (ref 10.4–31.8)
TIBC: 384 ug/dL (ref 250–450)
UIBC: 259 ug/dL (ref 148–442)

## 2022-09-12 LAB — FERRITIN: Ferritin: 20 ng/mL (ref 11–307)

## 2022-09-12 NOTE — Assessment & Plan Note (Signed)
This is a very pleasant 66 year old female patient who was referred to hematology for evaluation of thrombocytopenia.   She has past medical history significant for FAP status post total gastrectomy, proctocolectomy. Since her last visit, she was started on oral iron supplementation since her ferritin was 16.  She has been taking oral iron every day, denies any side effects with it.  No other bleeding complaints.  She continues to follow-up with gastroenterology regularly.  She does her mammogram with her gynecologist. No concerns on physical exam today.  Will repeat CBC, iron panel and ferritin today.  If she continues to have mild thrombocytopenia, we will plan to see her back in 1 year or sooner as needed.  She understands that her thrombocytopenia is very mild and should not have any bleeding complications.

## 2022-09-12 NOTE — Progress Notes (Signed)
Emma FOLLOW UP NOTE  Patient Care Team: Pcp, No as PCP - General  CHIEF COMPLAINTS/PURPOSE OF CONSULTATION:  Thrombocytopenia, follow-up  ASSESSMENT & PLAN:  Thrombocytopenia ([place]) This is a very pleasant 66 year old female patient who was referred to hematology for evaluation of thrombocytopenia.   She has past medical history significant for FAP status post total gastrectomy, proctocolectomy. Since her last visit, she was started on oral iron supplementation since her ferritin was 16.  She has been taking oral iron every day, denies any side effects with it.  No other bleeding complaints.  She continues to follow-up with gastroenterology regularly.  She does her mammogram with her gynecologist. No concerns on physical exam today.  Will repeat CBC, iron panel and ferritin today.  If she continues to have mild thrombocytopenia, we will plan to see her back in 1 year or sooner as needed.  She understands that her thrombocytopenia is very mild and should not have any bleeding complications.  Orders Placed This Encounter  Procedures   CBC with Differential/Platelet    Standing Status:   Standing    Number of Occurrences:   22    Standing Expiration Date:   09/13/2023   Iron and Iron Binding Capacity ([facility] only)    Standing Status:   Future    Number of Occurrences:   1    Standing Expiration Date:   09/13/2023   Ferritin    Standing Status:   Future    Number of Occurrences:   1    Standing Expiration Date:   09/12/2023    HISTORY OF PRESENTING ILLNESS:   Emma Lutz 66 y.o. female is here because of thrombocytopenia.  Oncology History   No history exists.   INTERIM HISTORY  This is a very pleasant 66 year old female patient who has previously seen [place] regarding her mild thrombocytopenia here for follow-up.  Patient continues to follow up with GI team given history of FAP.  No new changes. She denies any new bleeding complaints. Rest of the  pertinent 10 point ROS reviewed and negative.  REVIEW OF SYSTEMS:   Constitutional: Denies fevers, chills or abnormal night sweats Eyes: Denies blurriness of vision, double vision or watery eyes Ears, nose, mouth, throat, and face: Denies mucositis or sore throat Respiratory: Denies cough, dyspnea or wheezes Cardiovascular: Denies palpitation, chest discomfort or lower extremity swelling Gastrointestinal:  Denies nausea, heartburn or change in bowel habits Skin: Denies abnormal skin rashes Lymphatics: Denies new lymphadenopathy or easy bruising Neurological:Denies numbness, tingling or new weaknesses Behavioral/Psych: Mood is stable, no new changes  All other systems were reviewed with the patient and are negative.   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   Gastric polyps 09/23/2014   GERD (gastroesophageal reflux disease)    History of gastrectomy 02/06/2020   Osteoarthritis    Pancreatitis 2012   PONV (postoperative nausea and vomiting) 2012   nausea after last surgery   Pre-diabetes    Pulmonary embolism ([place]) 02/2012   multiple   Sessile rectal polyp    Tubular adenoma    gastric, multiple   Wears glasses  Wears glasses    Wears partial dentures     SURGICAL HISTORY: Past Surgical History:  Procedure Laterality Date   APPENDECTOMY     BIOPSY  06/25/2018   Procedure: BIOPSY;  Surgeon: Gatha Mayer, MD;  [facility];  Service: Endoscopy;;   BIOPSY  11/26/2018   Procedure: BIOPSY;  Surgeon: Irving Copas., MD;  Location: [place];  Service: Gastroenterology;;   CARPAL TUNNEL RELEASE Left    CHOLECYSTECTOMY  08/2019    DUODENECTOMY  08/2019   ELBOW SURGERY Right    ENDOSCOPIC MUCOSAL RESECTION N/A 07/17/2019   Procedure: ENDOSCOPIC MUCOSAL RESECTION;  Surgeon: Irving Copas., MD;  [facility];  Service: Gastroenterology;  Laterality: N/A;   ENTEROSCOPY N/A 12/16/2019   Procedure: ENTEROSCOPY;  Surgeon: Gatha Mayer, MD;  [facility];  Service: Endoscopy;  Laterality: N/A;  ENTEROSCOPY WITH STENT REMOVAL    ESOPHAGOGASTRODUODENOSCOPY     ESOPHAGOGASTRODUODENOSCOPY N/A 10/16/2014   Procedure: ESOPHAGOGASTRODUODENOSCOPY (EGD);  Surgeon: Gatha Mayer, MD;  Location: [facility];  Service: Endoscopy;  Laterality: N/A;  need ercp scope   ESOPHAGOGASTRODUODENOSCOPY N/A 12/24/2015   Procedure: ESOPHAGOGASTRODUODENOSCOPY (EGD);  Surgeon: Gatha Mayer, MD;  Location: [facility];  Service: Endoscopy;  Laterality: N/A;  May need ERCP scope   ESOPHAGOGASTRODUODENOSCOPY (EGD) WITH PROPOFOL N/A 01/10/2017   Procedure: ESOPHAGOGASTRODUODENOSCOPY (EGD) WITH PROPOFOL;  Surgeon: Gatha Mayer, MD;  [facility];  Service: Endoscopy;  Laterality: N/A;   ESOPHAGOGASTRODUODENOSCOPY (EGD) WITH PROPOFOL N/A 06/25/2018   Procedure: ESOPHAGOGASTRODUODENOSCOPY (EGD) WITH PROPOFOL;  Surgeon: Gatha Mayer, MD;  [facility];  Service: Endoscopy;  Laterality: N/A;   ESOPHAGOGASTRODUODENOSCOPY (EGD) WITH PROPOFOL N/A 09/17/2018   Procedure: ESOPHAGOGASTRODUODENOSCOPY (EGD) WITH PROPOFOL;  Surgeon: Rush Landmark Telford Nab., MD;  Location: [place];  Service: Gastroenterology;  Laterality: N/A;   ESOPHAGOGASTRODUODENOSCOPY (EGD) WITH PROPOFOL N/A 11/26/2018   Procedure: ESOPHAGOGASTRODUODENOSCOPY (EGD) WITH PROPOFOL;  Surgeon: Rush Landmark Telford Nab., MD;  Location: [place];  Service: Gastroenterology;  Laterality: N/A;  needs to be a 2 hour case   ESOPHAGOGASTRODUODENOSCOPY (EGD) WITH PROPOFOL N/A 07/17/2019   Procedure: ESOPHAGOGASTRODUODENOSCOPY (EGD) WITH PROPOFOL;  Surgeon:  Rush Landmark Telford Nab., MD;  Location: [facility];  Service: Gastroenterology;  Laterality: N/A;   FLEXIBLE SIGMOIDOSCOPY     FLEXIBLE SIGMOIDOSCOPY N/A 10/16/2014   Procedure: FLEXIBLE SIGMOIDOSCOPY;  Surgeon: Gatha Mayer, MD;  [facility];  Service: Endoscopy;  Laterality: N/A;   FLEXIBLE SIGMOIDOSCOPY N/A 12/24/2015   Procedure: FLEXIBLE SIGMOIDOSCOPY;  Surgeon: Gatha Mayer, MD;  [facility];  Service: Endoscopy;  Laterality: N/A;   FLEXIBLE SIGMOIDOSCOPY N/A 01/10/2017   Procedure: FLEXIBLE SIGMOIDOSCOPY;  Surgeon: Gatha Mayer, MD;  [facility];  Service: Endoscopy;  Laterality: N/A;   FLEXIBLE SIGMOIDOSCOPY N/A 06/25/2018   Procedure: FLEXIBLE SIGMOIDOSCOPY;  Surgeon: Gatha Mayer, MD;  [facility];  Service: Endoscopy;  Laterality: N/A;   FLEXIBLE SIGMOIDOSCOPY N/A 07/17/2019   Procedure: FLEXIBLE SIGMOIDOSCOPY;  Surgeon: Rush Landmark Telford Nab., MD;  Location: [facility];  Service: Gastroenterology;  Laterality: N/A;   FOREIGN BODY REMOVAL  07/17/2019   Procedure: FOREIGN BODY REMOVAL;  Surgeon: Rush Landmark Telford Nab., MD;  Location: [facility];  Service: Gastroenterology;;   GASTROINTESTINAL STENT REMOVAL  12/16/2019   Procedure: GASTROINTESTINAL STENT REMOVAL;  Surgeon: Gatha Mayer, MD;  [facility];  Service: Endoscopy;;   HEMOSTASIS CLIP PLACEMENT  07/17/2019   Procedure: HEMOSTASIS CLIP PLACEMENT;  Surgeon: Irving Copas., MD;  [facility];  Service: Gastroenterology;;   HEMOSTASIS CONTROL  07/17/2019   Procedure: HEMOSTASIS CONTROL;  Surgeon: Irving Copas., MD;  Location: [facility];  Service: Gastroenterology;;  EPI   HERNIA REPAIR     ILEOSTOMY CLOSURE  08/17/11   [facility]   LUMBAR SPINE SURGERY     X 2   MULTIPLE TOOTH EXTRACTIONS     POLYPECTOMY  11/26/2018   Procedure: POLYPECTOMY;  Surgeon: Rush Landmark Telford Nab., MD;  Location: [place];  Service:  Gastroenterology;;   POLYPECTOMY  07/17/2019   Procedure: POLYPECTOMY;  Surgeon: Irving Copas., MD;  [facility];  Service: Gastroenterology;;   PROCTECTOMY  02/16/11   with loop ileostomy [facility] CUFF REPAIR Right 1990   SUBMUCOSAL LIFTING INJECTION  07/17/2019   Procedure: SUBMUCOSAL LIFTING INJECTION;  Surgeon: Irving Copas., MD;  [facility];  Service: Gastroenterology;;   TOTAL COLECTOMY  1976   with ileostomy/ileo-rectal anastomosis   TOTAL GASTRECTOMY  08/2019   TUBAL LIGATION      SOCIAL HISTORY: Social History   Socioeconomic History   Marital status: Widowed    Spouse name: Not on file   Number of children: 2   Years of education: Not on file   Highest education level: Not on file  Occupational History   Occupation: employed    Employer: [employer]   Occupation: Scientist, clinical (histocompatibility and immunogenetics) support  Tobacco Use   Smoking status: Never   Smokeless tobacco: Never  Scientific laboratory technician Use: Never used  Substance and Sexual Activity   Alcohol use: No    Alcohol/week: 0.0 standard drinks of alcohol   Drug use: No   Sexual activity: Not on file  Other Topics Concern   Not on file  Social History Narrative   The patient is widowed she has 2 daughters and grandchildren that live in [place]   She works as a Hotel manager support person-retired   2-3 caffeinated beverages daily   Social Determinants of Radio broadcast assistant Strain: Not on Art therapist Insecurity: Not on file  Transportation Needs: Not on file  Physical Activity: Not on file  Stress: Not on file  Social Connections: Not on file  Intimate Partner Violence: Not on file    FAMILY HISTORY: Family History  Problem Relation Age of Onset   Heart disease Mother    Emphysema Mother    Familial polyposis Father    Esophageal cancer Father    Colon cancer Father    Diabetes Father    Colon polyps Father    Kidney disease Father     Transient ischemic attack Father    Brain cancer Sister    Rectal cancer Neg Hx    Stomach cancer Neg Hx    Inflammatory bowel disease Neg Hx    Liver disease Neg Hx    Pancreatic cancer Neg Hx     ALLERGIES:  is allergic to sulfonamide derivatives.  MEDICATIONS:  Current Outpatient Medications  Medication Sig Dispense Refill   Cholecalciferol (VITAMIN D3) 50 MCG (2000 UT) TABS TAKE 1 & 1/2 (ONE & ONE-HALF) TABLETS BY MOUTH ONCE DAILY 45 tablet 0   clotrimazole-betamethasone (LOTRISONE) cream Apply topically 2 (two) times daily as needed.     Cyanocobalamin (VITAMIN B 12) 500 MCG TABS Take 1,000 capsules by mouth daily. 60 tablet 11   Multiple Vitamin (MULTIVITAMIN) tablet Take 1 tablet by mouth daily.     nystatin-triamcinolone (MYCOLOG II) cream Apply  1 application topically daily as needed (Skin Rash).      PARoxetine (PAXIL-CR) 25 MG 24 hr tablet Take 25 mg by mouth daily.      zoledronic acid (RECLAST) 5 MG/100ML SOLN injection Inject 5 mg into the vein as directed. Once a year     No current facility-administered medications for this visit.   PHYSICAL EXAMINATION: ECOG PERFORMANCE STATUS: 0 - Asymptomatic  Vitals:   09/12/22 1328  BP: (!) 142/90  Pulse: 78  Resp: 16  Temp: 97.9 F (36.6 C)  SpO2: 98%   Filed Weights   09/12/22 1328  Weight: 164 lb 4.8 oz (74.5 kg)    Physical Exam Constitutional:      Appearance: Normal appearance.  HENT:     Head: Normocephalic and atraumatic.  Cardiovascular:     Rate and Rhythm: Normal rate and regular rhythm.  Pulmonary:     Effort: Pulmonary effort is normal.     Breath sounds: Normal breath sounds.  Musculoskeletal:        General: No swelling or tenderness.  Neurological:     General: No focal deficit present.     Mental Status: She is alert.  Psychiatric:        Mood and Affect: Mood normal.      LABORATORY DATA:  I have reviewed the data as listed Lab Results  Component Value Date   WBC 4.1 09/12/2022    HGB 15.5 (H) 09/12/2022   HCT 46.8 (H) 09/12/2022   MCV 90.9 09/12/2022   PLT 144 (L) 09/12/2022     Chemistry      Component Value Date/Time   NA 139 07/08/2020 1202   K 4.0 07/08/2020 1202   CL 103 07/08/2020 1202   CO2 30 07/08/2020 1202   BUN 8 07/08/2020 1202   CREATININE 0.65 07/08/2020 1202      Component Value Date/Time   CALCIUM 9.6 07/08/2020 1202   ALKPHOS 86 07/08/2020 1202   AST 20 07/08/2020 1202   ALT 13 07/08/2020 1202   BILITOT 1.1 07/08/2020 1202     RADIOGRAPHIC STUDIES: I have personally reviewed the radiological images as listed and agreed with the findings in the report. No results found. Total time spent including history, physical exam, review of records, counseling and coordination of care of 20 minutes All questions were answered. The patient knows to call the clinic with any problems, questions or concerns.    Benay Pike, MD 09/12/2022 2:34 PM

## 2022-09-15 ENCOUNTER — Telehealth: Payer: Self-pay | Admitting: *Deleted

## 2022-09-15 NOTE — Telephone Encounter (Addendum)
Contacted patient and LVM with information per Dr. Chryl Heck in message below. Encouraged patient to contact office for further questions or concerns.   ----- Message from Benay Pike, MD sent at 09/14/2022  5:06 PM EST ----- She can continue iron based on her labs. We can continue to monitor.

## 2023-08-21 ENCOUNTER — Telehealth: Payer: Self-pay | Admitting: Hematology and Oncology

## 2023-08-21 NOTE — Telephone Encounter (Signed)
Spoke with patient confirming upcoming appointment change

## 2023-09-12 ENCOUNTER — Inpatient Hospital Stay (HOSPITAL_BASED_OUTPATIENT_CLINIC_OR_DEPARTMENT_OTHER): Payer: Medicare Other | Admitting: Hematology and Oncology

## 2023-09-12 ENCOUNTER — Inpatient Hospital Stay: Payer: Medicare Other | Attending: Internal Medicine

## 2023-09-12 ENCOUNTER — Other Ambulatory Visit: Payer: Self-pay

## 2023-09-12 VITALS — BP 124/76 | HR 65 | Temp 98.4°F | Resp 14 | Wt 167.9 lb

## 2023-09-12 DIAGNOSIS — D582 Other hemoglobinopathies: Secondary | ICD-10-CM | POA: Insufficient documentation

## 2023-09-12 DIAGNOSIS — Z808 Family history of malignant neoplasm of other organs or systems: Secondary | ICD-10-CM | POA: Insufficient documentation

## 2023-09-12 DIAGNOSIS — M810 Age-related osteoporosis without current pathological fracture: Secondary | ICD-10-CM | POA: Insufficient documentation

## 2023-09-12 DIAGNOSIS — D696 Thrombocytopenia, unspecified: Secondary | ICD-10-CM | POA: Insufficient documentation

## 2023-09-12 DIAGNOSIS — Z800 Family history of malignant neoplasm of digestive organs: Secondary | ICD-10-CM | POA: Insufficient documentation

## 2023-09-12 LAB — CBC WITH DIFFERENTIAL/PLATELET
Abs Immature Granulocytes: 0 10*3/uL (ref 0.00–0.07)
Basophils Absolute: 0.1 10*3/uL (ref 0.0–0.1)
Basophils Relative: 1 %
Eosinophils Absolute: 0.1 10*3/uL (ref 0.0–0.5)
Eosinophils Relative: 2 %
HCT: 48.1 % — ABNORMAL HIGH (ref 36.0–46.0)
Hemoglobin: 15.5 g/dL — ABNORMAL HIGH (ref 12.0–15.0)
Immature Granulocytes: 0 %
Lymphocytes Relative: 31 %
Lymphs Abs: 1.3 10*3/uL (ref 0.7–4.0)
MCH: 29.4 pg (ref 26.0–34.0)
MCHC: 32.2 g/dL (ref 30.0–36.0)
MCV: 91.3 fL (ref 80.0–100.0)
Monocytes Absolute: 0.4 10*3/uL (ref 0.1–1.0)
Monocytes Relative: 10 %
Neutro Abs: 2.3 10*3/uL (ref 1.7–7.7)
Neutrophils Relative %: 56 %
Platelets: 150 10*3/uL (ref 150–400)
RBC: 5.27 MIL/uL — ABNORMAL HIGH (ref 3.87–5.11)
RDW: 12.5 % (ref 11.5–15.5)
WBC: 4.2 10*3/uL (ref 4.0–10.5)
nRBC: 0 % (ref 0.0–0.2)

## 2023-09-12 NOTE — Progress Notes (Signed)
[facility] FOLLOW UP NOTE  Patient Care Team: Pcp, No as PCP - General  CHIEF COMPLAINTS/PURPOSE OF CONSULTATION:  Thrombocytopenia, follow-up  ASSESSMENT & PLAN:  Thrombocytopenia (HCC) This is a very pleasant 67 year old female patient who was referred to hematology for evaluation of thrombocytopenia.     Mild Thrombocytopenia Stable platelet count, no significant changes over the past two years.  No need for frequent monitoring. Continue routine blood work with primary care physician. Return to hematology as needed if significant changes in blood counts.  Elevated Hemoglobin Slight elevation in hemoglobin levels, stable over the past two years. No known cause identified. No symptoms of sleep apnea or history of smoking. -Continue monitoring with routine blood work through primary care physician.  Osteoporosis Patient due for annual Reclast IV, pending scheduling. -Ensure Reclast IV is scheduled and administered.  General Health Maintenance -Up to date with mammograms and colonoscopies. -Continue regular dental care.   No orders of the defined types were placed in this encounter.   HISTORY OF PRESENTING ILLNESS:   Emma Lutz 67 y.o. female is here because of thrombocytopenia.  Oncology History   No history exists.   Discussed the use of AI scribe software for clinical note transcription with the patient, who gave verbal consent to proceed.  History of Present Illness    The patient, with a history of mild thrombocytopenia and elevated hemoglobin, presents for a routine follow-up. She reports no recent hospitalizations or specialist visits. She has stopped taking niacinamide and has started over-the-counter iron 65mg  daily. She is due for a Reclast IV for osteoporosis, which is typically scheduled annually, but has not yet been scheduled for this year's dose. She has no new dental issues, as her dentist has been managing her oral health.  The  patient's blood counts are stable, with normal platelets at 150 and slightly elevated hemoglobin at 15.5, which has been consistent for the past two years. She denies sleep apnea and smoking. She has had no significant changes in her health status over the past two years.  Rest of the pertinent 10 point ROS reviewed and negative.  REVIEW OF SYSTEMS:   Constitutional: Denies fevers, chills or abnormal night sweats Eyes: Denies blurriness of vision, double vision or watery eyes Ears, nose, mouth, throat, and face: Denies mucositis or sore throat Respiratory: Denies cough, dyspnea or wheezes Cardiovascular: Denies palpitation, chest discomfort or lower extremity swelling Gastrointestinal:  Denies nausea, heartburn or change in bowel habits Skin: Denies abnormal skin rashes Lymphatics: Denies new lymphadenopathy or easy bruising Neurological:Denies numbness, tingling or new weaknesses Behavioral/Psych: Mood is stable, no new changes  All other systems were reviewed with the patient and are negative.   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   Osteoarthritis    Pancreatitis 2012   PONV (postoperative nausea and vomiting) 2012   nausea after last surgery   Pre-diabetes    Pulmonary embolism (HCC) 02/2012   multiple   Sessile rectal polyp    Tubular adenoma  gastric, multiple   Wears glasses    Wears glasses    Wears partial dentures     SURGICAL HISTORY: Past Surgical History:  Procedure Laterality Date   APPENDECTOMY     BIOPSY  06/25/2018   Procedure:  BIOPSY;  Surgeon: Iva Boop, MD;  Location: [facility] ENDOSCOPY;  Service: Endoscopy;;   BIOPSY  11/26/2018   Procedure: BIOPSY;  Surgeon: Lemar Lofty., MD;  Location: [facility] ENDOSCOPY;  Service: Gastroenterology;;   CARPAL TUNNEL RELEASE Left    CHOLECYSTECTOMY  08/2019   DUODENECTOMY  08/2019   ELBOW SURGERY Right    ENDOSCOPIC MUCOSAL RESECTION N/A 07/17/2019   Procedure: ENDOSCOPIC MUCOSAL RESECTION;  Surgeon: Lemar Lofty., MD;  Location: [facility] ENDOSCOPY;  Service: Gastroenterology;  Laterality: N/A;   ENTEROSCOPY N/A 12/16/2019   Procedure: ENTEROSCOPY;  Surgeon: Iva Boop, MD;  Location: [facility] ENDOSCOPY;  Service: Endoscopy;  Laterality: N/A;  ENTEROSCOPY WITH STENT REMOVAL    ESOPHAGOGASTRODUODENOSCOPY     ESOPHAGOGASTRODUODENOSCOPY N/A 10/16/2014   Procedure: ESOPHAGOGASTRODUODENOSCOPY (EGD);  Surgeon: Iva Boop, MD;  Location: [facility] ENDOSCOPY;  Service: Endoscopy;  Laterality: N/A;  need ercp scope   ESOPHAGOGASTRODUODENOSCOPY N/A 12/24/2015   Procedure: ESOPHAGOGASTRODUODENOSCOPY (EGD);  Surgeon: Iva Boop, MD;  Location: [facility] ENDOSCOPY;  Service: Endoscopy;  Laterality: N/A;  May need ERCP scope   ESOPHAGOGASTRODUODENOSCOPY (EGD) WITH PROPOFOL N/A 01/10/2017   Procedure: ESOPHAGOGASTRODUODENOSCOPY (EGD) WITH PROPOFOL;  Surgeon: Iva Boop, MD;  Location: [facility] ENDOSCOPY;  Service: Endoscopy;  Laterality: N/A;   ESOPHAGOGASTRODUODENOSCOPY (EGD) WITH PROPOFOL N/A 06/25/2018   Procedure: ESOPHAGOGASTRODUODENOSCOPY (EGD) WITH PROPOFOL;  Surgeon: Iva Boop, MD;  Location: [facility] ENDOSCOPY;  Service: Endoscopy;  Laterality: N/A;   ESOPHAGOGASTRODUODENOSCOPY (EGD) WITH PROPOFOL N/A 09/17/2018   Procedure: ESOPHAGOGASTRODUODENOSCOPY (EGD) WITH PROPOFOL;  Surgeon: Meridee Score Netty Starring., MD;  Location: [facility] ENDOSCOPY;  Service: Gastroenterology;  Laterality: N/A;   ESOPHAGOGASTRODUODENOSCOPY (EGD) WITH PROPOFOL N/A 11/26/2018   Procedure: ESOPHAGOGASTRODUODENOSCOPY (EGD) WITH  PROPOFOL;  Surgeon: Meridee Score Netty Starring., MD;  Location: [facility] ENDOSCOPY;  Service: Gastroenterology;  Laterality: N/A;  needs to be a 2 hour case   ESOPHAGOGASTRODUODENOSCOPY (EGD) WITH PROPOFOL N/A 07/17/2019   Procedure: ESOPHAGOGASTRODUODENOSCOPY (EGD) WITH PROPOFOL;  Surgeon: Meridee Score Netty Starring., MD;  Location: [facility] ENDOSCOPY;  Service: Gastroenterology;  Laterality: N/A;   FLEXIBLE SIGMOIDOSCOPY     FLEXIBLE SIGMOIDOSCOPY N/A 10/16/2014   Procedure: FLEXIBLE SIGMOIDOSCOPY;  Surgeon: Iva Boop, MD;  Location: [facility] ENDOSCOPY;  Service: Endoscopy;  Laterality: N/A;   FLEXIBLE SIGMOIDOSCOPY N/A 12/24/2015   Procedure: FLEXIBLE SIGMOIDOSCOPY;  Surgeon: Iva Boop, MD;  Location: [facility] ENDOSCOPY;  Service: Endoscopy;  Laterality: N/A;   FLEXIBLE SIGMOIDOSCOPY N/A 01/10/2017   Procedure: FLEXIBLE SIGMOIDOSCOPY;  Surgeon: Iva Boop, MD;  Location: [facility] ENDOSCOPY;  Service: Endoscopy;  Laterality: N/A;   FLEXIBLE SIGMOIDOSCOPY N/A 06/25/2018   Procedure: FLEXIBLE SIGMOIDOSCOPY;  Surgeon: Iva Boop, MD;  Location: [facility] ENDOSCOPY;  Service: Endoscopy;  Laterality: N/A;   FLEXIBLE SIGMOIDOSCOPY N/A 07/17/2019   Procedure: FLEXIBLE SIGMOIDOSCOPY;  Surgeon: Meridee Score Netty Starring., MD;  Location: [facility] ENDOSCOPY;  Service: Gastroenterology;  Laterality: N/A;   FOREIGN BODY REMOVAL  07/17/2019   Procedure: FOREIGN BODY REMOVAL;  Surgeon: Meridee Score Netty Starring., MD;  Location: [facility] ENDOSCOPY;  Service: Gastroenterology;;   GASTROINTESTINAL STENT REMOVAL  12/16/2019   Procedure: GASTROINTESTINAL STENT REMOVAL;  Surgeon: Iva Boop, MD;  Location: [facility] ENDOSCOPY;  Service: Endoscopy;;   HEMOSTASIS CLIP PLACEMENT  07/17/2019   Procedure: HEMOSTASIS CLIP PLACEMENT;  Surgeon: Lemar Lofty., MD;  Location: [facility] ENDOSCOPY;  Service: Gastroenterology;;   HEMOSTASIS CONTROL  07/17/2019   Procedure: HEMOSTASIS CONTROL;  Surgeon: Lemar Lofty., MD;  Location: [facility] ENDOSCOPY;  Service:  Gastroenterology;;  EPI   HERNIA REPAIR     ILEOSTOMY CLOSURE  08/17/11   Dr. Abigail Miyamoto [facility]   LUMBAR SPINE SURGERY     X 2   MULTIPLE TOOTH EXTRACTIONS     POLYPECTOMY  11/26/2018   Procedure: POLYPECTOMY;  Surgeon: Lemar Lofty., MD;  Location: [facility] ENDOSCOPY;  Service: Gastroenterology;;   POLYPECTOMY  07/17/2019   Procedure: POLYPECTOMY;  Surgeon: Lemar Lofty., MD;  Location: [facility] ENDOSCOPY;  Service: Gastroenterology;;   PROCTECTOMY  02/16/11   with loop ileostomy [facility]rt. [facility]   ROTATOR CUFF REPAIR Right 1990   SUBMUCOSAL LIFTING INJECTION  07/17/2019   Procedure: SUBMUCOSAL LIFTING INJECTION;  Surgeon: Lemar Lofty., MD;  Location: [facility] ENDOSCOPY;  Service: Gastroenterology;;   TOTAL COLECTOMY  1976   with ileostomy/ileo-rectal anastomosis   TOTAL GASTRECTOMY  08/2019   TUBAL LIGATION      SOCIAL HISTORY: Social History   Socioeconomic History   Marital status: Widowed    Spouse name: Not on file   Number of children: 2   Years of education: Not on file   Highest education level: Not on file  Occupational History   Occupation: employed    Employer: [employer]   Occupation: Therapist, occupational support  Tobacco Use   Smoking status: Never   Smokeless tobacco: Never  Vaping Use   Vaping status: Never Used  Substance and Sexual Activity   Alcohol use: No    Alcohol/week: 0.0 standard drinks of alcohol   Drug use: No   Sexual activity: Not on file  Other Topics Concern   Not on file  Social History Narrative   The patient is widowed she has 2 daughters and grandchildren that live in [place]   She works as a Insurance underwriter support person-retired   2-3 caffeinated beverages daily   Social Determinants of Corporate investment banker Strain: Not on Ship broker Insecurity: Not on file  Transportation Needs: Not on file  Physical Activity: Not on file  Stress: Not on file  Social Connections: Not on file  Intimate Partner  Violence: Not on file    FAMILY HISTORY: Family History  Problem Relation Age of Onset   Heart disease Mother    Emphysema Mother    Familial polyposis Father    Esophageal cancer Father    Colon cancer Father    Diabetes Father    Colon polyps Father    Kidney disease Father    Transient ischemic attack Father    Brain cancer Sister    Rectal cancer Neg Hx    Stomach cancer Neg Hx    Inflammatory bowel disease Neg Hx    Liver disease Neg Hx    Pancreatic cancer Neg Hx     ALLERGIES:  is allergic to sulfonamide derivatives.  MEDICATIONS:  Current Outpatient Medications  Medication Sig Dispense Refill   ferrous sulfate 324 MG TBEC Take 324 mg by mouth daily with breakfast.     Cholecalciferol (VITAMIN D3) 50 MCG (2000 UT) TABS TAKE 1 & 1/2 (ONE & ONE-HALF) TABLETS BY MOUTH ONCE DAILY 45 tablet 0   clotrimazole-betamethasone (LOTRISONE) cream Apply topically 2 (two) times daily as needed.     Cyanocobalamin (VITAMIN B 12) 500 MCG TABS Take 1,000 capsules  by mouth daily. 60 tablet 11   Multiple Vitamin (MULTIVITAMIN) tablet Take 1 tablet by mouth daily.     PARoxetine (PAXIL-CR) 25 MG 24 hr tablet Take 25 mg by mouth daily.      zoledronic acid (RECLAST) 5 MG/100ML SOLN injection Inject 5 mg into the vein as directed. Once a year     No current facility-administered medications for this visit.   PHYSICAL EXAMINATION: ECOG PERFORMANCE STATUS: 0 - Asymptomatic  Vitals:   09/12/23 1253  BP: 124/76  Pulse: 65  Resp: 14  Temp: 98.4 F (36.9 C)  SpO2: 95%   Filed Weights   09/12/23 1253  Weight: 167 lb 14.4 oz (76.2 kg)    Physical Exam Constitutional:      Appearance: Normal appearance.  HENT:     Head: Normocephalic and atraumatic.  Cardiovascular:     Rate and Rhythm: Normal rate and regular rhythm.  Pulmonary:     Effort: Pulmonary effort is normal.     Breath sounds: Normal breath sounds.  Musculoskeletal:        General: No swelling or tenderness.   Neurological:     General: No focal deficit present.     Mental Status: She is alert.  Psychiatric:        Mood and Affect: Mood normal.      LABORATORY DATA:  I have reviewed the data as listed Lab Results  Component Value Date   WBC 4.2 09/12/2023   HGB 15.5 (H) 09/12/2023   HCT 48.1 (H) 09/12/2023   MCV 91.3 09/12/2023   PLT 150 09/12/2023     Chemistry      Component Value Date/Time   NA 139 07/08/2020 1202   K 4.0 07/08/2020 1202   CL 103 07/08/2020 1202   CO2 30 07/08/2020 1202   BUN 8 07/08/2020 1202   CREATININE 0.65 07/08/2020 1202      Component Value Date/Time   CALCIUM 9.6 07/08/2020 1202   ALKPHOS 86 07/08/2020 1202   AST 20 07/08/2020 1202   ALT 13 07/08/2020 1202   BILITOT 1.1 07/08/2020 1202     RADIOGRAPHIC STUDIES: I have personally reviewed the radiological images as listed and agreed with the findings in the report. No results found.  Total time spent including history, physical exam, review of records, counseling and coordination of care of 20 minutes All questions were answered. The patient knows to call the clinic with any problems, questions or concerns.    Rachel Moulds, MD 09/12/2023 2:39 PM

## 2023-09-12 NOTE — Assessment & Plan Note (Signed)
This is a very pleasant 67 year old female patient who was referred to hematology for evaluation of thrombocytopenia.     Mild Thrombocytopenia Stable platelet count, no significant changes over the past two years.  No need for frequent monitoring. Continue routine blood work with primary care physician. Return to hematology as needed if significant changes in blood counts.  Elevated Hemoglobin Slight elevation in hemoglobin levels, stable over the past two years. No known cause identified. No symptoms of sleep apnea or history of smoking. -Continue monitoring with routine blood work through primary care physician.  Osteoporosis Patient due for annual Reclast IV, pending scheduling. -Ensure Reclast IV is scheduled and administered.  General Health Maintenance -Up to date with mammograms and colonoscopies. -Continue regular dental care.

## 2023-09-13 ENCOUNTER — Other Ambulatory Visit: Payer: Medicare Other

## 2023-09-13 ENCOUNTER — Ambulatory Visit: Payer: Medicare Other | Admitting: Hematology and Oncology

## 2024-03-25 ENCOUNTER — Telehealth: Payer: Self-pay

## 2024-03-25 ENCOUNTER — Other Ambulatory Visit: Payer: Self-pay

## 2024-03-25 DIAGNOSIS — M810 Age-related osteoporosis without current pathological fracture: Secondary | ICD-10-CM | POA: Insufficient documentation

## 2024-03-25 NOTE — Telephone Encounter (Signed)
 Auth Submission: NO AUTH NEEDED Site of care: Site of care: [facility] Payer: Medicare A/B Medication & CPT/J Code(s) submitted: Reclast (Zolendronic acid) [id number] Route of submission (phone, fax, portal):  Phone # Fax # Auth type: Buy/Bill PB Units/visits requested: 5mg  x 1 dose Reference number:  Approval from: 03/25/24 to 10/16/24

## 2024-03-26 ENCOUNTER — Encounter: Payer: Self-pay | Admitting: Obstetrics and Gynecology

## 2024-03-29 ENCOUNTER — Encounter: Payer: Self-pay | Admitting: Obstetrics and Gynecology

## 2024-04-01 ENCOUNTER — Ambulatory Visit (AMBULATORY_SURGERY_CENTER)

## 2024-04-01 VITALS — Ht 62.0 in | Wt 155.0 lb

## 2024-04-01 DIAGNOSIS — D128 Benign neoplasm of rectum: Secondary | ICD-10-CM

## 2024-04-01 DIAGNOSIS — D1391 Familial adenomatous polyposis: Secondary | ICD-10-CM

## 2024-04-01 DIAGNOSIS — Z860100 Personal history of colon polyps, unspecified: Secondary | ICD-10-CM

## 2024-04-01 NOTE — Progress Notes (Signed)
 No egg or soy allergy known to patient  No issues known to pt with past sedation with any surgeries or procedures Patient denies ever being told they had issues or difficulty with intubation  No FH of Malignant Hyperthermia Pt is not on diet pills Pt is not on  home 02  Pt is not on blood thinners  Pt denies issues with constipation  No A fib or A flutter Have any cardiac testing pending--no  LOA: independent  Prep: flex sig    PV completed with patient. Prep instructions sent via mychart and home address.

## 2024-04-02 ENCOUNTER — Encounter: Payer: Self-pay | Admitting: Internal Medicine

## 2024-04-04 ENCOUNTER — Ambulatory Visit (INDEPENDENT_AMBULATORY_CARE_PROVIDER_SITE_OTHER): Admitting: *Deleted

## 2024-04-04 ENCOUNTER — Encounter: Payer: Self-pay | Admitting: Obstetrics and Gynecology

## 2024-04-04 VITALS — BP 105/68 | HR 70 | Temp 98.8°F | Resp 16 | Ht 62.0 in | Wt 166.8 lb

## 2024-04-04 DIAGNOSIS — M810 Age-related osteoporosis without current pathological fracture: Secondary | ICD-10-CM

## 2024-04-04 MED ORDER — ACETAMINOPHEN 325 MG PO TABS: 650.0000 mg | ORAL_TABLET | Freq: Once | ORAL | Status: DC

## 2024-04-04 MED ORDER — DIPHENHYDRAMINE HCL 25 MG PO CAPS: 25.0000 mg | ORAL_CAPSULE | Freq: Once | ORAL | Status: DC

## 2024-04-04 MED ORDER — ZOLEDRONIC ACID 5 MG/100ML IV SOLN
5.0000 mg | Freq: Once | INTRAVENOUS | Status: AC
  Administered 2024-04-04: 5 mg via INTRAVENOUS
  Filled 2024-04-04: qty 100

## 2024-04-04 NOTE — Addendum Note (Signed)
 Addended by: Selicia Windom E on: 04/04/2024 04:21 PM   Modules accepted: Orders

## 2024-04-04 NOTE — Progress Notes (Signed)
 Diagnosis: Osteoporosis  Provider:  Mannam, Praveen MD  Procedure: IV Infusion  IV Type: Peripheral, IV Location: L Forearm  Reclast (Zolendronic Acid), Dose: 5 mg  Infusion Start Time: 1342 pm  Infusion Stop Time: 1416 pm  Post Infusion IV Care: Observation period completed and Peripheral IV Discontinued  Discharge: Condition: Good, Destination: Home . AVS Declined  Performed by:  Mayme Spearman, RN

## 2024-04-16 NOTE — Progress Notes (Unsigned)
  Gastroenterology History and Physical   Primary Care Physician:  Pcp, No   Reason for Procedure:  FAP-familial adenomatous polyposis  Plan:    Pouchoscopy     HPI: Emma Lutz is a 68 y.o. 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   Osteoarthritis    Pancreatitis 2012   PONV (postoperative nausea and vomiting) 2012   nausea after last surgery   Pre-diabetes    Pulmonary embolism (HCC) 02/2012   multiple   Sessile rectal polyp    Tubular adenoma    gastric, multiple   Wears glasses    Wears glasses    Wears partial dentures     Past Surgical History:  Procedure Laterality Date   APPENDECTOMY     BIOPSY  06/25/2018   Procedure: BIOPSY;  Surgeon: Avram Lupita BRAVO, MD;  Location: [facility];  Service: Endoscopy;;   BIOPSY  11/26/2018   Procedure: BIOPSY;  Surgeon: Wilhelmenia Aloha Raddle., MD;  Location: [facility]COPY;  Service: Gastroenterology;;   CARPAL TUNNEL RELEASE Left    CHOLECYSTECTOMY  08/2019   DUODENECTOMY  08/2019   ELBOW SURGERY Right    ENDOSCOPIC MUCOSAL RESECTION N/A 07/17/2019   Procedure: ENDOSCOPIC MUCOSAL RESECTION;  Surgeon: Wilhelmenia Aloha Raddle., MD;  Location: [facility];  Service: Gastroenterology;  Laterality: N/A;   ENTEROSCOPY N/A 12/16/2019   Procedure: ENTEROSCOPY;  Surgeon: Avram Lupita BRAVO, MD;  Location: [facility];  Service: Endoscopy;  Laterality: N/A;   ENTEROSCOPY WITH STENT REMOVAL    ESOPHAGOGASTRODUODENOSCOPY     ESOPHAGOGASTRODUODENOSCOPY N/A 10/16/2014   Procedure: ESOPHAGOGASTRODUODENOSCOPY (EGD);  Surgeon: Lupita BRAVO Avram, MD;  Location: [facility];  Service: Endoscopy;  Laterality: N/A;  need ercp scope   ESOPHAGOGASTRODUODENOSCOPY N/A 12/24/2015   Procedure: ESOPHAGOGASTRODUODENOSCOPY (EGD);  Surgeon: Lupita BRAVO Avram, MD;  Location: [facility];  Service: Endoscopy;  Laterality: N/A;  May need ERCP scope   ESOPHAGOGASTRODUODENOSCOPY (EGD) WITH PROPOFOL  N/A 01/10/2017   Procedure: ESOPHAGOGASTRODUODENOSCOPY (EGD) WITH PROPOFOL ;  Surgeon: Lupita BRAVO Avram, MD;  Location: [facility];  Service: Endoscopy;  Laterality: N/A;   ESOPHAGOGASTRODUODENOSCOPY (EGD) WITH PROPOFOL  N/A 06/25/2018   Procedure: ESOPHAGOGASTRODUODENOSCOPY (EGD) WITH PROPOFOL ;  Surgeon: Avram Lupita BRAVO, MD;  Location: [facility];  Service: Endoscopy;  Laterality: N/A;   ESOPHAGOGASTRODUODENOSCOPY (EGD) WITH PROPOFOL  N/A 09/17/2018   Procedure: ESOPHAGOGASTRODUODENOSCOPY (EGD) WITH PROPOFOL ;  Surgeon: Wilhelmenia Aloha Raddle., MD;  Location: [facility] END[facility]COPY;  Service: Gastroenterology;  Laterality: N/A;   ESOPHAGOGASTRODUODENOSCOPY (EGD) WITH PROPOFOL  N/A 11/26/2018   Procedure: ESOPHAGOGASTRODUODENOSCOPY (EGD) WITH PROPOFOL ;  Surgeon: Wilhelmenia Aloha Raddle., MD;  Location: [facility] ENDOSCOPY;  Service: Gastroenterology;  Laterality: N/A;  needs to be a 2 hour case   ESOPHAGOGASTRODUODENOSCOPY (EGD) WITH PROPOFOL  N/A 07/17/2019   Procedure: ESOPHAGOGASTRODUODENOSCOPY (EGD) WITH PROPOFOL ;  Surgeon: Wilhelmenia Aloha Raddle., MD;  Location: [facility];  Service: Gastroenterology;  Laterality: N/A;   FLEXIBLE SIGMOIDOSCOPY     FLEXIBLE SIGMOIDOSCOPY N/A 10/16/2014  Procedure: FLEXIBLE SIGMOIDOSCOPY;  Surgeon: Lupita FORBES Commander, MD;  Location: [facility];  Service: Endoscopy;  Laterality: N/A;   FLEXIBLE SIGMOIDOSCOPY N/A 12/24/2015   Procedure: FLEXIBLE SIGMOIDOSCOPY;  Surgeon: Lupita FORBES Commander, MD;  Location: [facility];  Service: Endoscopy;  Laterality: N/A;   FLEXIBLE SIGMOIDOSCOPY N/A 01/10/2017   Procedure: FLEXIBLE SIGMOIDOSCOPY;  Surgeon: Lupita FORBES Commander, MD;  Location: [facility];  Service: Endoscopy;  Laterality: N/A;   FLEXIBLE SIGMOIDOSCOPY N/A 06/25/2018   Procedure: FLEXIBLE SIGMOIDOSCOPY;  Surgeon: Lutz Lupita FORBES, MD;  Location: [facility];  Service: Endoscopy;  Laterality: N/A;   FLEXIBLE SIGMOIDOSCOPY N/A 07/17/2019   Procedure: FLEXIBLE SIGMOIDOSCOPY;  Surgeon: Wilhelmenia Aloha Raddle., MD;  Location: [facility];  Service: Gastroenterology;  Laterality: N/A;   FOREIGN BODY REMOVAL  07/17/2019   Procedure: FOREIGN BODY REMOVAL;  Surgeon: Wilhelmenia Aloha Raddle., MD;  Location: [facility];  Service: Gastroenterology;;   GASTROINTESTINAL STENT REMOVAL  12/16/2019   Procedure: GASTROINTESTINAL STENT REMOVAL;  Surgeon: Lutz Lupita FORBES, MD;  Location: [facility];  Service: Endoscopy;;   HEMOSTASIS CLIP PLACEMENT  07/17/2019   Procedure: HEMOSTASIS CLIP PLACEMENT;  Surgeon: Wilhelmenia Aloha Raddle., MD;  Location: [facility];  Service: Gastroenterology;;   HEMOSTASIS CONTROL  07/17/2019   Procedure: HEMOSTASIS CONTROL;  Surgeon: Wilhelmenia Aloha Raddle., MD;  Location: [facility];  Service: Gastroenterology;;  EPI   HERNIA REPAIR     ILEOSTOMY CLOSURE  08/17/11   Dr. [facility]   LUMBAR SPINE SURGERY     X 2   MULTIPLE TOOTH EXTRACTIONS     POLYPECTOMY  11/26/2018   Procedure: POLYPECTOMY;  Surgeon: Wilhelmenia Aloha Raddle., MD;  Location: [facility] ENDOSCOPY;  Service: Gastroenterology;;   POLYPECTOMY  07/17/2019   Procedure: POLYPECTOMY;  Surgeon: Wilhelmenia Aloha Raddle., MD;  Location: [facility];  Service: Gastroenterology;;   PROCTECTOMY  02/16/11   with loop ileostomy [facility]rt. [facility]   ROTATOR CUFF REPAIR Right 1990   SUBMUCOSAL LIFTING INJECTION  07/17/2019   Procedure: SUBMUCOSAL LIFTING INJECTION;  Surgeon: Wilhelmenia Aloha Raddle., MD;  Location: [facility];  Service: Gastroenterology;;   TOTAL COLECTOMY  1976   with ileostomy/ileo-rectal anastomosis   TOTAL GASTRECTOMY  08/2019   TUBAL LIGATION        Current Outpatient Medications  Medication Sig Dispense Refill   Calcium Carbonate-Vitamin D 600-5 MG-MCG CAPS Take 1 capsule by mouth daily.     Cholecalciferol (VITAMIN D3) 50 MCG (2000 UT) TABS TAKE 1 & 1/2 (ONE & ONE-HALF) TABLETS BY MOUTH ONCE DAILY 45 tablet 0   clotrimazole-betamethasone (LOTRISONE) cream Apply topically 2 (two) times daily as needed.     Cyanocobalamin (VITAMIN B 12) 500 MCG TABS Take 1,000 capsules by mouth daily. 60 tablet 11   ferrous sulfate 324 MG TBEC Take 324 mg by mouth daily with breakfast.     Multiple Vitamin (MULTIVITAMIN) tablet Take 1 tablet by mouth daily.     PARoxetine (PAXIL-CR) 25 MG 24 hr tablet Take 25 mg by mouth daily.  (Patient taking differently: Take 20 mg by mouth daily.)     zoledronic  acid (RECLAST ) 5 MG/100ML SOLN injection Inject 5 mg into the vein as directed. Once a year     No current facility-administered medications for this visit.    Allergies as of 04/17/2024 - Review Complete 04/01/2024  Allergen Reaction Noted   Sulfonamide derivatives Rash 03/26/2010    Family History  Problem Relation Age of Onset   Heart disease Mother    Emphysema Mother  Familial polyposis Father    Esophageal cancer Father    Colon cancer Father    Diabetes Father    Colon polyps Father    Kidney disease Father    Transient ischemic attack Father    Brain cancer Sister    Rectal cancer Neg Hx    Stomach cancer Neg Hx    Inflammatory bowel disease Neg Hx    Liver disease Neg Hx    Pancreatic cancer Neg Hx     Social History   Socioeconomic History   Marital status: Widowed    Spouse name: Not on file   Number of children: 2   Years of education: Not on file   Highest education level: Not on file  Occupational History   Occupation: employed    Employer: [employer]   Occupation: Therapist, occupational support  Tobacco Use   Smoking status: Never   Smokeless tobacco: Never  Vaping Use   Vaping status: Never Used  Substance and Sexual Activity   Alcohol use: No    Alcohol/week: 0.0 standard drinks of alcohol   Drug use: No   Sexual activity: Not on file  Other Topics Concern   Not on file  Social History Narrative   The patient is widowed she has 2 daughters and grandchildren that live in [place]   She works as a Insurance underwriter support person-retired   2-3 caffeinated beverages daily   Social Drivers of Corporate investment banker Strain: Not on file  Food Insecurity: Not on file  Transportation Needs: Not on file  Physical Activity: Not on file  Stress: Not on file  Social Connections: Not on file  Intimate Partner Violence: Not on file    Review of Systems: Positive for *** All other review of systems negative except as mentioned in the HPI.  Physical Exam: Vital signs There were no vitals taken for this visit.  General:   Alert,  Well-developed, well-nourished, pleasant and cooperative in NAD Lungs:  Clear throughout to auscultation.   Heart:  Regular rate and rhythm; no murmurs, clicks, rubs,  or gallops. Abdomen:  Soft, nontender and nondistended. Normal bowel sounds.   Neuro/Psych:  Alert and cooperative. Normal mood and affect. A and O x 3   @Emma Lutz  Emma Commander, MD, [facility]astroenter[facility] [phone] (pager) 04/16/2024 6:06 PM@

## 2024-04-17 ENCOUNTER — Encounter: Payer: Self-pay | Admitting: Internal Medicine

## 2024-04-17 ENCOUNTER — Ambulatory Visit: Admitting: Internal Medicine

## 2024-04-17 VITALS — BP 122/70 | HR 67 | Temp 98.0°F | Resp 16 | Ht 62.0 in | Wt 166.0 lb

## 2024-04-17 DIAGNOSIS — D128 Benign neoplasm of rectum: Secondary | ICD-10-CM

## 2024-04-17 DIAGNOSIS — D1391 Familial adenomatous polyposis: Secondary | ICD-10-CM | POA: Diagnosis not present

## 2024-04-17 MED ORDER — SODIUM CHLORIDE 0.9 % IV SOLN: 500.0000 mL | INTRAVENOUS | Status: DC

## 2024-04-17 NOTE — Patient Instructions (Addendum)
 Once again there is polyp tissue in the small area of remaining rectum.  I removed everything I saw.  I will let you know the pathology results and the recommendation about when to return.  I do not think there was a definitive way to completely and permanently remove the polyp tissue.  I will think about it and seek opinions.    I appreciate the opportunity to care for you. Emma CHARLENA Commander, MD, FACG   YOU HAD AN ENDOSCOPIC PROCEDURE TODAY AT THE [facility]:   Refer to the procedure report that was given to you for any specific questions about what was found during the examination.  If the procedure report does not answer your questions, please call your gastroenterologist to clarify.  If you requested that your care partner not be given the details of your procedure findings, then the procedure report has been included in a sealed envelope for you to review at your convenience later.  YOU SHOULD EXPECT: Some feelings of bloating in the abdomen. Passage of more gas than usual.  Walking can help get rid of the air that was put into your GI tract during the procedure and reduce the bloating. If you had a lower endoscopy (such as a colonoscopy or flexible sigmoidoscopy) you may notice spotting of blood in your stool or on the toilet paper. If you underwent a bowel prep for your procedure, you may not have a normal bowel movement for a few days.  Please Note:  You might notice some irritation and congestion in your nose or some drainage.  This is from the oxygen used during your procedure.  There is no need for concern and it should clear up in a day or so.  SYMPTOMS TO REPORT IMMEDIATELY:  Following lower endoscopy (colonoscopy or flexible sigmoidoscopy):  Excessive amounts of blood in the stool  Significant tenderness or worsening of abdominal pains  Swelling of the abdomen that is new, acute  Fever of 100F or higher  For urgent or emergent issues, a gastroenterologist can be  reached at any hour by calling [phone]. Do not use MyChart messaging for urgent concerns.    DIET:  We do recommend a small meal at first, but then you may proceed to your regular diet.  Drink plenty of fluids but you should avoid alcoholic beverages for 24 hours.  ACTIVITY:  You should plan to take it easy for the rest of today and you should NOT DRIVE or use heavy machinery until tomorrow (because of the sedation medicines used during the test).    FOLLOW UP: Our staff will call the number listed on your records the next business day following your procedure.  We will call around 7:15- 8:00 am to check on you and address any questions or concerns that you may have regarding the information given to you following your procedure. If we do not reach you, we will leave a message.     If any biopsies were taken you will be contacted by phone or by letter within the next 1-3 weeks.  Please call us  at [phone] if you have not heard about the biopsies in 3 weeks.    SIGNATURES/CONFIDENTIALITY: You and/or your care partner have signed paperwork which will be entered into your electronic medical record.  These signatures attest to the fact that that the information above on your After Visit Summary has been reviewed and is understood.  Full responsibility of the confidentiality of this discharge information lies  with you and/or your care-partner.

## 2024-04-17 NOTE — Op Note (Signed)
 [facility] Patient Name: Emma Lutz Procedure Date: 04/17/2024 2:43 PM MRN: [id number] Endoscopist: Lupita FORBES Commander , MD, [id number] Age: 68 Referring MD:  Date of Birth: 01-05-1956 Gender: Female Account #: [id number] Procedure:                Pouchoscopy Indications:              History of total colectomy (subtotal then                            proctectomy), History of familial polyposis Medicines:                Monitored Anesthesia Care Procedure:                Pre-Anesthesia Assessment:                           - Prior to the procedure, a History and Physical                            was performed, and patient medications and                            allergies were reviewed. The patient's tolerance of                            previous anesthesia was also reviewed. The risks                            and benefits of the procedure and the sedation                            options and risks were discussed with the patient.                            All questions were answered, and informed consent                            was obtained. Prior Anticoagulants: The patient has                            taken no anticoagulant or antiplatelet agents. ASA                            Grade Assessment: II - A patient with mild systemic                            disease. After reviewing the risks and benefits,                            the patient was deemed in satisfactory condition to                            undergo the procedure.  After obtaining informed consent, the endoscope was                            passed under direct vision. Throughout the                            procedure, the patient's blood pressure, pulse, and                            oxygen saturations were monitored continuously. The                            Olympus Scope [id number] was introduced through the                            ileoanal anastomosis  via the anus and advanced to                            the J-pouch. The procedure was performed without                            difficulty. The patient tolerated the procedure                            well. The quality of the bowel preparation was good. Scope In: 3:09:16 PM Scope Out: 3:20:39 PM Total Procedure Duration: 0 hours 11 minutes 23 seconds  Findings:                 Patient is status-post total colectomy with an                            ileal pouch-anal anastomosis.                           The rectal cuff contained multiple sessile,                            non-bleeding polyps. The polyp was removed with a                            cold snare. The polyp was removed with a piecemeal                            technique using a cold snare. Resection and                            retrieval were complete. Verification of patient                            identification for the specimen was done. Estimated                            blood loss was minimal.  The exam was otherwise without abnormality. Complications:            No immediate complications. Estimated Blood Loss:     Estimated blood loss was minimal. Impression:               - Multiple polyps in rectal cuff, removed with a                            cold snare and removed piecemeal using a cold                            snare. Resected and retrieved.                           - The examination was otherwise normal. Recommendation:           - Patient has a contact number available for                            emergencies. The signs and symptoms of potential                            delayed complications were discussed with the                            patient. Return to normal activities tomorrow.                            Written discharge instructions were provided to the                            patient.                           - Resume previous diet.                            - Repeat post-surgical lower GI endoscopy date to                            be determined after pending pathology results are                            reviewed for surveillance. Lupita FORBES Commander, MD 04/17/2024 3:35:35 PM This report has been signed electronically.

## 2024-04-17 NOTE — Progress Notes (Signed)
 Vss nad trans to pacu

## 2024-04-17 NOTE — Progress Notes (Signed)
 Pt's states no medical or surgical changes since previsit or office visit.

## 2024-04-17 NOTE — Progress Notes (Signed)
 Called to room to assist during endoscopic procedure.  Patient ID and intended procedure confirmed with present staff. Received instructions for my participation in the procedure from the performing physician.

## 2024-04-18 ENCOUNTER — Telehealth: Payer: Self-pay

## 2024-04-18 NOTE — Telephone Encounter (Signed)
  Follow up Call-     04/17/2024    1:57 PM 04/08/2022    2:08 PM  Call back number  Post procedure Call Back phone  # [phone]  Permission to leave phone message Yes Yes     Patient questions:  Do you have a fever, pain , or abdominal swelling? No. Pain Score  0 *  Have you tolerated food without any problems? Yes.    Have you been able to return to your normal activities? Yes.    Do you have any questions about your discharge instructions: Diet   No. Medications  No. Follow up visit  No.  Do you have questions or concerns about your Care? No.  Actions: * If pain score is 4 or above: No action needed, pain <4.

## 2024-04-23 ENCOUNTER — Ambulatory Visit: Payer: Self-pay | Admitting: Internal Medicine

## 2024-04-23 LAB — SURGICAL PATHOLOGY
